# Patient Record
Sex: Female | Born: 1955 | Race: Black or African American | Hispanic: No | Smoking: Current every day smoker
Health system: Southern US, Community
[De-identification: ages and names within clinical notes are randomized; demographics above are authoritative.]

## PROBLEM LIST (undated history)

## (undated) DIAGNOSIS — M5440 Lumbago with sciatica, unspecified side: Secondary | ICD-10-CM

## (undated) DIAGNOSIS — E119 Type 2 diabetes mellitus without complications: Secondary | ICD-10-CM

## (undated) DIAGNOSIS — I10 Essential (primary) hypertension: Secondary | ICD-10-CM

## (undated) DIAGNOSIS — F1410 Cocaine abuse, uncomplicated: Secondary | ICD-10-CM

## (undated) DIAGNOSIS — Z91148 Patient's other noncompliance with medication regimen for other reason: Secondary | ICD-10-CM

## (undated) DIAGNOSIS — F319 Bipolar disorder, unspecified: Secondary | ICD-10-CM

## (undated) DIAGNOSIS — M5441 Lumbago with sciatica, right side: Secondary | ICD-10-CM

## (undated) DIAGNOSIS — Z9114 Patient's other noncompliance with medication regimen: Secondary | ICD-10-CM

## (undated) DIAGNOSIS — F209 Schizophrenia, unspecified: Secondary | ICD-10-CM

## (undated) DIAGNOSIS — G8929 Other chronic pain: Secondary | ICD-10-CM

## (undated) DIAGNOSIS — E785 Hyperlipidemia, unspecified: Secondary | ICD-10-CM

## (undated) DIAGNOSIS — J40 Bronchitis, not specified as acute or chronic: Secondary | ICD-10-CM

## (undated) HISTORY — PX: TUBAL LIGATION: SHX77

---

## 2013-03-06 ENCOUNTER — Emergency Department: Payer: Self-pay | Admitting: Emergency Medicine

## 2013-03-16 ENCOUNTER — Emergency Department: Payer: Self-pay | Admitting: Internal Medicine

## 2013-03-16 LAB — URINALYSIS, COMPLETE
Bacteria: NONE SEEN
Bilirubin,UR: NEGATIVE
Blood: NEGATIVE
Ketone: NEGATIVE
Leukocyte Esterase: NEGATIVE
Ph: 6 (ref 4.5–8.0)
RBC,UR: 1 /HPF (ref 0–5)
Specific Gravity: 1.015 (ref 1.003–1.030)
WBC UR: <1 /HPF (ref 0–5)

## 2013-03-16 LAB — BASIC METABOLIC PANEL
Anion Gap: 6 — ABNORMAL LOW (ref 7–16)
BUN: 9 mg/dL (ref 7–18)
Calcium, Total: 8.7 mg/dL (ref 8.5–10.1)
Co2: 26 mmol/L (ref 21–32)
Glucose: 125 mg/dL — ABNORMAL HIGH (ref 65–99)
Potassium: 4.3 mmol/L (ref 3.5–5.1)
Sodium: 136 mmol/L (ref 136–145)

## 2013-03-16 LAB — CBC
HCT: 33.1 % — ABNORMAL LOW (ref 35.0–47.0)
HGB: 10.9 g/dL — ABNORMAL LOW (ref 12.0–16.0)
MCH: 26.3 pg (ref 26.0–34.0)
MCV: 80 fL (ref 80–100)
Platelet: 259 10*3/uL (ref 150–440)
WBC: 7.2 10*3/uL (ref 3.6–11.0)

## 2013-03-16 LAB — TROPONIN I: Troponin-I: <0.02 ng/mL

## 2013-03-16 LAB — DRUG SCREEN, URINE
Barbiturates, Ur Screen: NEGATIVE (ref ?–200)
Benzodiazepine, Ur Scrn: NEGATIVE (ref ?–200)
Cannabinoid 50 Ng, Ur ~~LOC~~: NEGATIVE (ref ?–50)
MDMA (Ecstasy)Ur Screen: NEGATIVE (ref ?–500)
Methadone, Ur Screen: NEGATIVE (ref ?–300)
Phencyclidine (PCP) Ur S: NEGATIVE (ref ?–25)
Tricyclic, Ur Screen: NEGATIVE (ref ?–1000)

## 2013-03-16 LAB — CK TOTAL AND CKMB (NOT AT ARMC)
CK, Total: 311 U/L — ABNORMAL HIGH (ref 21–215)
CK-MB: 2.1 ng/mL (ref 0.5–3.6)

## 2014-03-13 IMAGING — CR DG CHEST 2V
1 series · 2 of 2 positions shown · non-contrast
Comparison: none

REASON FOR EXAM: SOB
COMMENTS:

PROCEDURE:     DXR - DXR CHEST PA (OR AP) AND LATERAL  - March 16, 2013  [DATE]
RESULT:     The lungs are clear. The heart and pulmonary vessels are normal.
The bony and mediastinal structures are unremarkable. There is no effusion.
There is no pneumothorax or evidence of congestive failure.

[Series 1: w chest pa · 0.14mm/px · 2 of 2 slices shown]
[im 1/2]
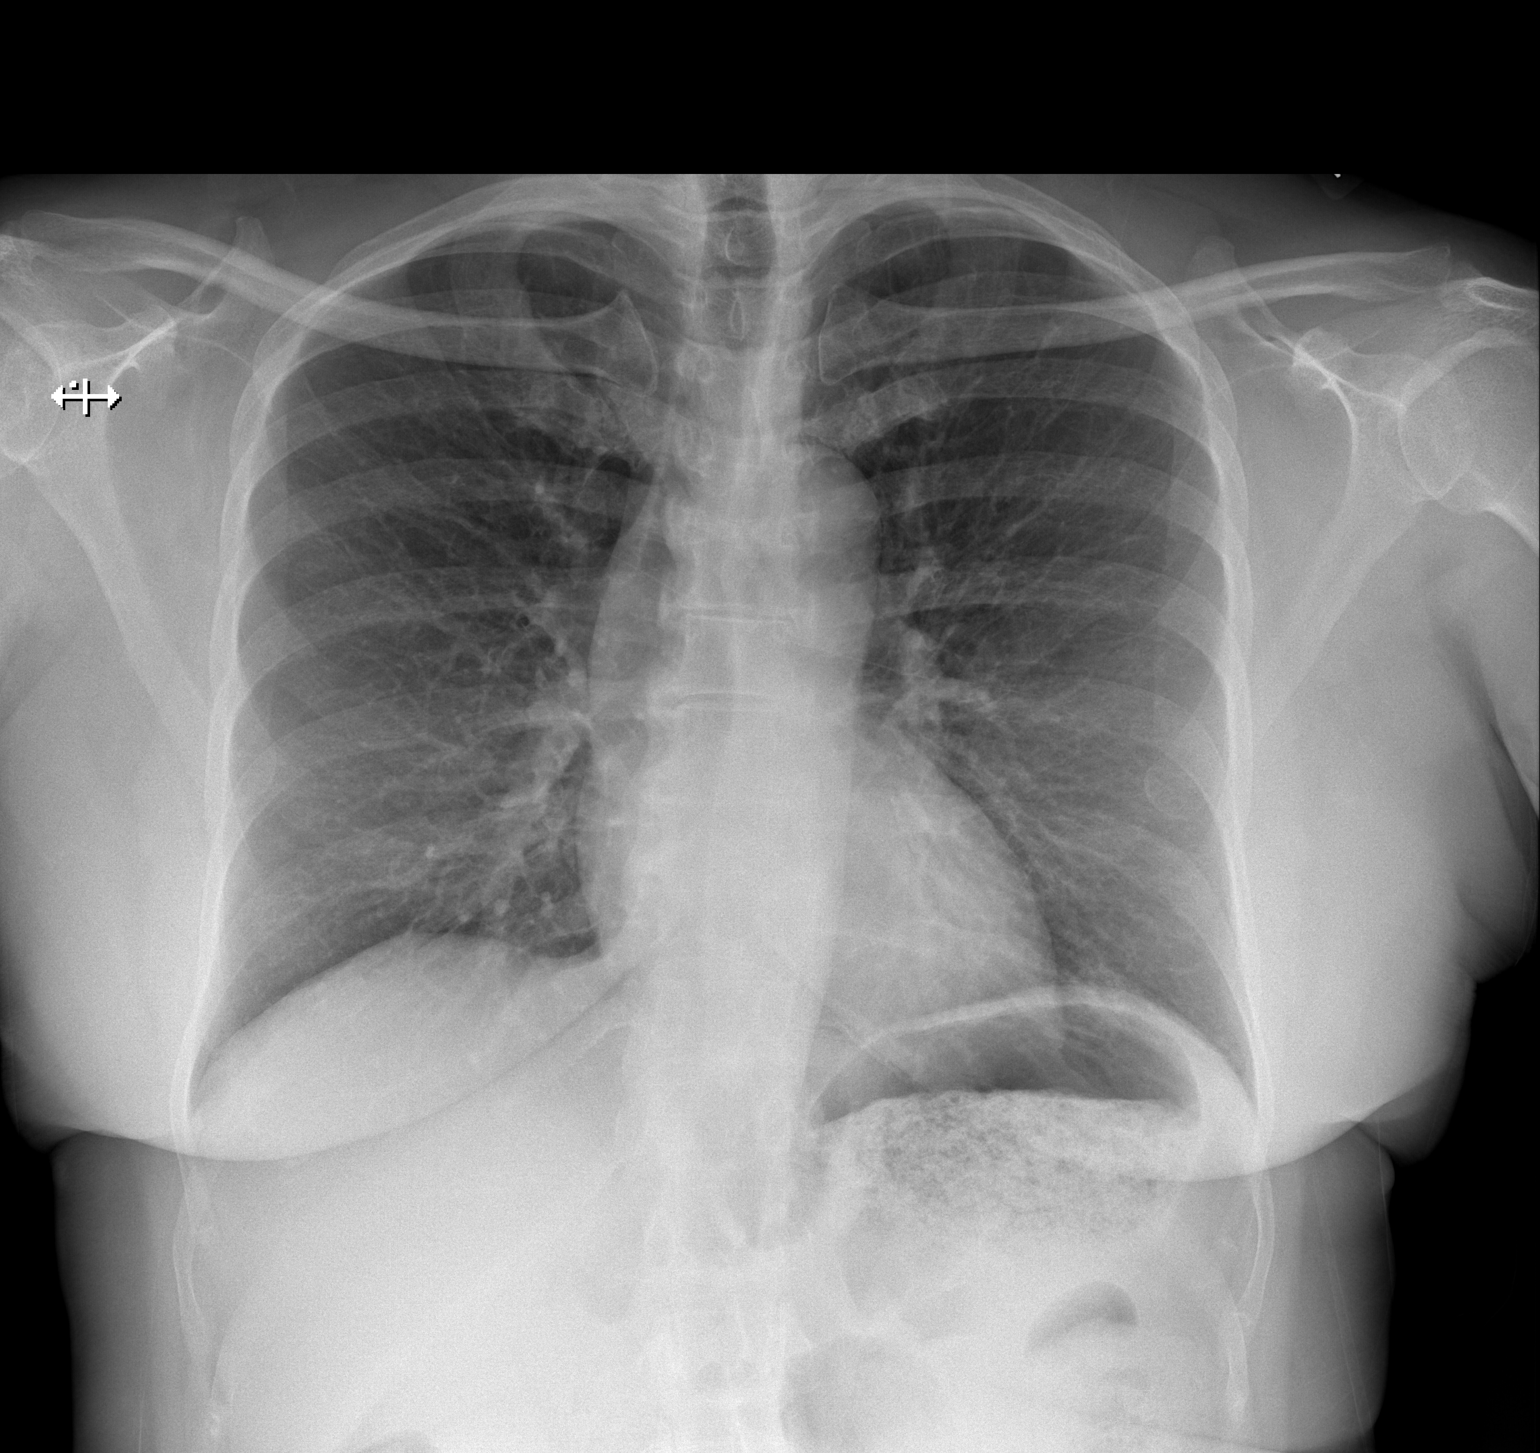
[im 2/2]
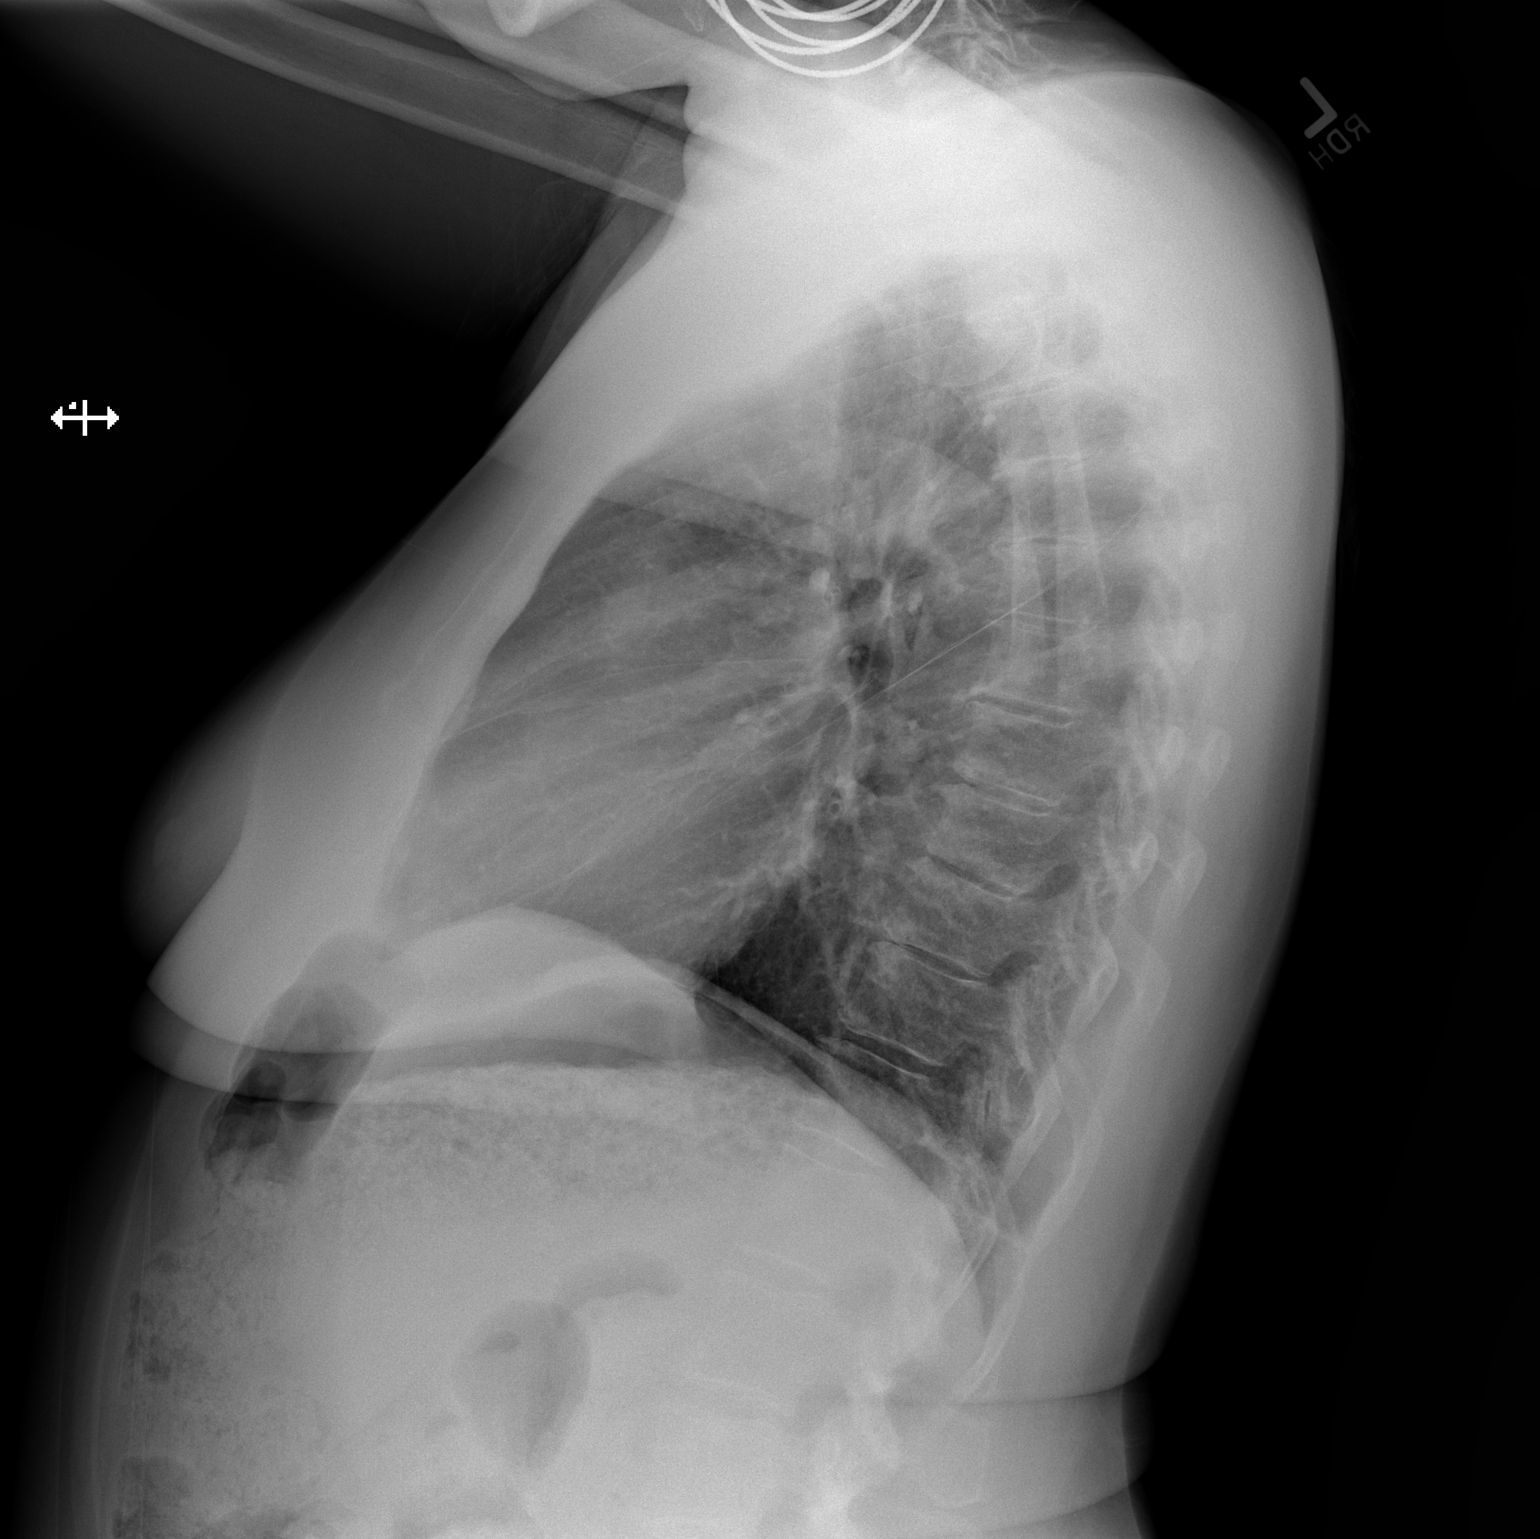

[2 of 2 positions shown; findings below may reference images not displayed]

IMPRESSION: No acute cardiopulmonary disease.

[REDACTED]

## 2016-06-02 ENCOUNTER — Encounter: Payer: Self-pay | Admitting: Emergency Medicine

## 2016-06-02 ENCOUNTER — Emergency Department
Admission: EM | Admit: 2016-06-02 | Discharge: 2016-06-05 | Disposition: A | Discharge status: Other Acute Inpatient Hospital | Payer: Medicare (Managed Care) | Attending: Emergency Medicine | Admitting: Emergency Medicine

## 2016-06-02 DIAGNOSIS — F17200 Nicotine dependence, unspecified, uncomplicated: Secondary | ICD-10-CM | POA: Diagnosis present

## 2016-06-02 DIAGNOSIS — F315 Bipolar disorder, current episode depressed, severe, with psychotic features: Secondary | ICD-10-CM | POA: Diagnosis present

## 2016-06-02 DIAGNOSIS — Z79899 Other long term (current) drug therapy: Secondary | ICD-10-CM | POA: Diagnosis not present

## 2016-06-02 DIAGNOSIS — E785 Hyperlipidemia, unspecified: Secondary | ICD-10-CM | POA: Diagnosis not present

## 2016-06-02 DIAGNOSIS — E119 Type 2 diabetes mellitus without complications: Secondary | ICD-10-CM | POA: Diagnosis not present

## 2016-06-02 DIAGNOSIS — F319 Bipolar disorder, unspecified: Secondary | ICD-10-CM | POA: Diagnosis not present

## 2016-06-02 DIAGNOSIS — R45851 Suicidal ideations: Secondary | ICD-10-CM | POA: Insufficient documentation

## 2016-06-02 DIAGNOSIS — F17210 Nicotine dependence, cigarettes, uncomplicated: Secondary | ICD-10-CM | POA: Diagnosis not present

## 2016-06-02 DIAGNOSIS — I10 Essential (primary) hypertension: Secondary | ICD-10-CM | POA: Diagnosis not present

## 2016-06-02 DIAGNOSIS — F209 Schizophrenia, unspecified: Secondary | ICD-10-CM | POA: Diagnosis not present

## 2016-06-02 DIAGNOSIS — K219 Gastro-esophageal reflux disease without esophagitis: Secondary | ICD-10-CM | POA: Diagnosis present

## 2016-06-02 DIAGNOSIS — F1320 Sedative, hypnotic or anxiolytic dependence, uncomplicated: Secondary | ICD-10-CM | POA: Diagnosis present

## 2016-06-02 HISTORY — DX: Type 2 diabetes mellitus without complications: E11.9

## 2016-06-02 HISTORY — DX: Schizophrenia, unspecified: F20.9

## 2016-06-02 HISTORY — DX: Bronchitis, not specified as acute or chronic: J40

## 2016-06-02 HISTORY — DX: Hyperlipidemia, unspecified: E78.5

## 2016-06-02 HISTORY — DX: Bipolar disorder, unspecified: F31.9

## 2016-06-02 HISTORY — DX: Essential (primary) hypertension: I10

## 2016-06-02 LAB — ETHANOL

## 2016-06-02 LAB — COMPREHENSIVE METABOLIC PANEL
ALK PHOS: 71 U/L (ref 38–126)
ALT: 15 U/L (ref 14–54)
AST: 19 U/L (ref 15–41)
Albumin: 4.2 g/dL (ref 3.5–5.0)
Anion gap: 8 (ref 5–15)
BILIRUBIN TOTAL: 0.6 mg/dL (ref 0.3–1.2)
BUN: 13 mg/dL (ref 6–20)
CALCIUM: 9.5 mg/dL (ref 8.9–10.3)
CO2: 25 mmol/L (ref 22–32)
CREATININE: 0.81 mg/dL (ref 0.44–1.00)
Chloride: 105 mmol/L (ref 101–111)
GFR calc Af Amer: >60 mL/min (ref >60)
Glucose, Bld: 120 mg/dL — ABNORMAL HIGH (ref 65–99)
Potassium: 3.9 mmol/L (ref 3.5–5.1)
Sodium: 138 mmol/L (ref 135–145)
TOTAL PROTEIN: 8.2 g/dL — AB (ref 6.5–8.1)

## 2016-06-02 LAB — URINE DRUG SCREEN, QUALITATIVE (ARMC ONLY)
Amphetamines, Ur Screen: NOT DETECTED
Barbiturates, Ur Screen: NOT DETECTED
Benzodiazepine, Ur Scrn: POSITIVE — AB
CANNABINOID 50 NG, UR ~~LOC~~: NOT DETECTED
Cocaine Metabolite,Ur ~~LOC~~: POSITIVE — AB
MDMA (ECSTASY) UR SCREEN: NOT DETECTED
Methadone Scn, Ur: NOT DETECTED
Opiate, Ur Screen: NOT DETECTED
PHENCYCLIDINE (PCP) UR S: NOT DETECTED
Tricyclic, Ur Screen: NOT DETECTED

## 2016-06-02 LAB — CBC
HCT: 36.2 % (ref 35.0–47.0)
Hemoglobin: 11.6 g/dL — ABNORMAL LOW (ref 12.0–16.0)
MCH: 26.4 pg (ref 26.0–34.0)
MCHC: 32.2 g/dL (ref 32.0–36.0)
MCV: 82.1 fL (ref 80.0–100.0)
PLATELETS: 258 10*3/uL (ref 150–440)
RBC: 4.41 MIL/uL (ref 3.80–5.20)
RDW: 17.3 % — AB (ref 11.5–14.5)
WBC: 6.5 10*3/uL (ref 3.6–11.0)

## 2016-06-02 LAB — SALICYLATE LEVEL: Salicylate Lvl: 4.4 mg/dL (ref 2.8–30.0)

## 2016-06-02 LAB — ACETAMINOPHEN LEVEL: Acetaminophen (Tylenol), Serum: <10 ug/mL — ABNORMAL LOW (ref 10–30)

## 2016-06-02 MED ORDER — LORAZEPAM 1 MG PO TABS
1.0000 mg | ORAL_TABLET | Freq: Once | ORAL | Status: AC
  Administered 2016-06-02: 1 mg via ORAL
  Filled 2016-06-02: qty 1
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
## 2016-06-02 NOTE — ED Notes (Signed)
meds updated per pt verbal - she [facility] med[facility]  [place] - pharm tech to call in the am to verify meds

## 2016-06-02 NOTE — ED Provider Notes (Signed)
Time Seen: Approximately 1740 I have reviewed the triage notes  Chief Complaint: Suicidal and Depression   History of Present Illness: Linda Banks is a 60 y.o. female who presents with some suicidal ideation over the past week. Patient has a history of bipolar disease and schizophrenia. Medical history includes diabetes and hypertension. Patient states that she's been out of her pills that she takes for depression and bipolar disease over the past week or more. She states the last time that she had any medication was at the end of last month. Patient denies any suicidal plan. She states she has been hearing voices and has some paranoid thoughts. She has had 2 previous episodes of suicide attempts remotely with attempting to cut her wrist tendon 12 years ago and also had a overdose of medication. Patient states that she's had a lot of stress in her family with recent death of her mom and then chaos of the home in [place] and she's been staying locally in [place] with a cousin  Past Medical History  Diagnosis Date  . Bipolar 1 disorder (HCC)   . Schizophrenia (HCC)   . Diabetes mellitus without complication (HCC)   . Hypertension   . Hyperlipidemia   . Bronchitis     There are no active problems to display for this patient.   Past Surgical History  Procedure Laterality Date  . Tubal ligation      Past Surgical History  Procedure Laterality Date  . Tubal ligation      No current outpatient prescriptions on file.  Allergies:  Review of patient's allergies indicates no known allergies.  Family History: History reviewed. No pertinent family history.  Social History: Social History  Substance Use Topics  . Smoking status: Current Every Day Smoker -- 1.00 packs/day    Types: Cigarettes  . Smokeless tobacco: Never Used  . Alcohol Use: No     Review of Systems:   10 point review of systems was performed and was otherwise negative:  Constitutional: No fever Eyes: No  visual disturbances ENT: No sore throat, ear pain Cardiac: No chest pain Respiratory: No shortness of breath, wheezing, or stridor Abdomen: No abdominal pain, no vomiting, No diarrhea Endocrine: No weight loss, No night sweats Extremities: No peripheral edema, cyanosis Skin: No rashes, easy bruising Neurologic: No focal weakness, trouble with speech or swollowing Urologic: No dysuria, Hematuria, or urinary frequency   Physical Exam:  ED Triage Vitals  Enc Vitals Group     BP 06/02/16 1702 150/94 mmHg     Pulse Rate 06/02/16 1702 98     Resp 06/02/16 1702 18     Temp 06/02/16 1705 98.4 F (36.9 C)     Temp Source 06/02/16 1705 Oral     SpO2 06/02/16 1702 100 %     Weight 06/02/16 1702 176 lb (79.833 kg)     Height 06/02/16 1702 5\' 4"  (1.626 m)     Head Cir --      Peak Flow --      Pain Score 06/02/16 1702 8     Pain Loc --      Pain Edu? --      Excl. in GC? --     General: Awake , Alert , and Oriented times 3; GCS 15 Head: Normal cephalic , atraumatic Eyes: Pupils equal , round, reactive to light Nose/Throat: No nasal drainage, patent upper airway without erythema or exudate.  Neck: Supple, Full range of motion, No anterior adenopathy or palpable  thyroid masses Lungs: Clear to ascultation without wheezes , rhonchi, or rales Heart: Regular rate, regular rhythm without murmurs , gallops , or rubs Abdomen: Soft, non tender without rebound, guarding , or rigidity; bowel sounds positive and symmetric in all 4 quadrants. No organomegaly .        Extremities: 2 plus symmetric pulses. No edema, clubbing or cyanosis Neurologic: normal ambulation, Motor symmetric without deficits, sensory intact Skin: warm, dry, no rashes   Labs:   All laboratory work was reviewed including any pertinent negatives or positives listed below:  Labs Reviewed  CBC - Abnormal; Notable for the following:    Hemoglobin 11.6 (*)    RDW 17.3 (*)    All other components within normal limits   COMPREHENSIVE METABOLIC PANEL  ETHANOL  SALICYLATE LEVEL  ACETAMINOPHEN LEVEL  URINE DRUG SCREEN, QUALITATIVE ([facility] ONLY)  Laboratory work was reviewed and showed no clinically significant abnormalities.    ED Course:  Patient's here voluntarily and I felt we did not need to fill out an involuntary commitment paperwork and I don't feel she is a significant flight risk. If she does have sudden intense to leave then I will fill out IVC paperwork. Patient describes suicidal thoughts over the past week. She voluntarily presented here and has no physical complaints. Patient is medically cleared for psychiatric evaluation. We will attempt to continue her on her normal medications    Assessment: * Suicidal ideation Paranoid delusions      Plan: * Psychiatric consultation We will follow psychiatric recommendations.           Jennye MoccasinBrian S Quigley, MD 06/02/16 [id number]

## 2016-06-02 NOTE — ED Notes (Signed)
TTS att

## 2016-06-02 NOTE — ED Notes (Signed)

## 2016-06-02 NOTE — ED Notes (Signed)
Patient given sprite and graham crackers upon request.
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
## 2016-06-03 ENCOUNTER — Encounter: Payer: Self-pay | Admitting: Psychiatry

## 2016-06-03 DIAGNOSIS — F315 Bipolar disorder, current episode depressed, severe, with psychotic features: Secondary | ICD-10-CM | POA: Diagnosis not present

## 2016-06-03 DIAGNOSIS — K219 Gastro-esophageal reflux disease without esophagitis: Secondary | ICD-10-CM | POA: Diagnosis present

## 2016-06-03 DIAGNOSIS — F17200 Nicotine dependence, unspecified, uncomplicated: Secondary | ICD-10-CM | POA: Diagnosis present

## 2016-06-03 DIAGNOSIS — R45851 Suicidal ideations: Secondary | ICD-10-CM

## 2016-06-03 DIAGNOSIS — F1320 Sedative, hypnotic or anxiolytic dependence, uncomplicated: Secondary | ICD-10-CM | POA: Diagnosis present

## 2016-06-03 MED ORDER — NICOTINE 21 MG/24HR TD PT24
21.0000 mg | MEDICATED_PATCH | Freq: Every day | TRANSDERMAL | Status: DC
  Filled 2016-06-03 (×2): qty 1

## 2016-06-03 MED ORDER — ESCITALOPRAM OXALATE 10 MG PO TABS
20.0000 mg | ORAL_TABLET | Freq: Every day | ORAL | Status: DC
  Administered 2016-06-03 – 2016-06-05 (×3): 20 mg via ORAL
  Filled 2016-06-03 (×3): qty 2

## 2016-06-03 MED ORDER — IBUPROFEN 800 MG PO TABS
ORAL_TABLET | ORAL | Status: AC
  Administered 2016-06-03: 800 mg via ORAL
  Filled 2016-06-03: qty 1

## 2016-06-03 MED ORDER — LORAZEPAM 2 MG PO TABS
2.0000 mg | ORAL_TABLET | Freq: Four times a day (QID) | ORAL | Status: DC | PRN
  Administered 2016-06-03: 2 mg via ORAL

## 2016-06-03 MED ORDER — QUETIAPINE FUMARATE 200 MG PO TABS
200.0000 mg | ORAL_TABLET | Freq: Every day | ORAL | Status: DC
  Administered 2016-06-03 – 2016-06-04 (×2): 200 mg via ORAL
  Filled 2016-06-03 (×2): qty 1

## 2016-06-03 MED ORDER — HALOPERIDOL 5 MG PO TABS
ORAL_TABLET | ORAL | Status: AC
  Administered 2016-06-03: 5 mg via ORAL
  Filled 2016-06-03: qty 1

## 2016-06-03 MED ORDER — LORAZEPAM 2 MG PO TABS
ORAL_TABLET | ORAL | Status: AC
  Filled 2016-06-03: qty 1

## 2016-06-03 MED ORDER — CHLORDIAZEPOXIDE HCL 25 MG PO CAPS
25.0000 mg | ORAL_CAPSULE | Freq: Four times a day (QID) | ORAL | Status: DC
  Administered 2016-06-03 – 2016-06-05 (×8): 25 mg via ORAL
  Filled 2016-06-03 (×8): qty 1

## 2016-06-03 MED ORDER — TRAZODONE HCL 100 MG PO TABS
100.0000 mg | ORAL_TABLET | Freq: Every day | ORAL | Status: DC
  Administered 2016-06-03 – 2016-06-04 (×2): 100 mg via ORAL
  Filled 2016-06-03 (×2): qty 1

## 2016-06-03 MED ORDER — PANTOPRAZOLE SODIUM 40 MG PO TBEC
40.0000 mg | DELAYED_RELEASE_TABLET | Freq: Every day | ORAL | Status: DC
  Administered 2016-06-03 – 2016-06-05 (×3): 40 mg via ORAL
  Filled 2016-06-03 (×3): qty 1

## 2016-06-03 MED ORDER — DIPHENHYDRAMINE HCL 25 MG PO CAPS
50.0000 mg | ORAL_CAPSULE | Freq: Four times a day (QID) | ORAL | Status: DC | PRN
Start: 2016-06-03 — End: 2016-06-05
  Administered 2016-06-03 (×2): 50 mg via ORAL
  Filled 2016-06-03 (×2): qty 2

## 2016-06-03 MED ORDER — IBUPROFEN 800 MG PO TABS
800.0000 mg | ORAL_TABLET | Freq: Once | ORAL | Status: AC
  Administered 2016-06-03: 800 mg via ORAL

## 2016-06-03 MED ORDER — HALOPERIDOL 5 MG PO TABS
5.0000 mg | ORAL_TABLET | Freq: Four times a day (QID) | ORAL | Status: DC | PRN
  Administered 2016-06-03 (×2): 5 mg via ORAL
  Filled 2016-06-03: qty 1

## 2016-06-03 NOTE — ED Notes (Signed)
Patient resting quietly in room. No noted distress or abnormal behaviors noted. Will continue 15 minute checks and observation by security camera for safety. 

## 2016-06-03 NOTE — ED Notes (Signed)
ED BHU PLACEMENT JUSTIFICATION Is the patient under IVC or is there intent for IVC: No Is the patient medically cleared: Yes.   Is there vacancy in the ED BHU: Yes.   Is the population mix appropriate for patient: Yes.   Is the patient awaiting placement in inpatient or outpatient setting: Yes.   Has the patient had a psychiatric consult: Yes.   Survey of unit performed for contraband, proper placement and condition of furniture, tampering with fixtures in bathroom, shower, and each patient room: Yes.   APPEARANCE/BEHAVIOR calm, cooperative and adequate rapport can be established NEURO ASSESSMENT Orientation: time, place and person Hallucinations: Yes.  None noted (Hallucinations) Speech: Normal Gait: normal RESPIRATORY ASSESSMENT Normal expansion.  Clear to auscultation.  No rales, rhonchi, or wheezing. CARDIOVASCULAR ASSESSMENT regular rate and rhythm, S1, S2 normal, no murmur, click, rub or gallop GASTROINTESTINAL ASSESSMENT soft, nontender, BS WNL, no r/g EXTREMITIES normal strength, tone, and muscle mass PLAN OF CARE Provide calm/safe environment. Vital signs assessed twice daily. ED BHU Assessment once each 12-hour shift. Collaborate with intake RN daily or as condition indicates. Assure the ED provider has rounded once each shift. Provide and encourage hygiene. Provide redirection as needed. Assess for escalating behavior; address immediately and inform ED provider.  Assess family dynamic and appropriateness for visitation as needed: Yes.   Educate the patient/family about BHU procedures/visitation: Yes.

## 2016-06-03 NOTE — ED Notes (Signed)
voluntary 

## 2016-06-03 NOTE — ED Notes (Signed)
Pt calm and resting in bed. Compliant with medications. No concerns/ needs voiced at this time. No distress noted. Maintained on 15 minute checks and observation by security camera for safety.

## 2016-06-03 NOTE — Progress Notes (Signed)
Attempted to secure placement with the following facilities:  [facility]:  [facility]  Maryelizabeth Rowanressa Jasiyah Poland, MSW, Clare CharonLCSW, LCAS [facility] Triage Specialist [phone]

## 2016-06-03 NOTE — ED Notes (Signed)
Pt laying down in room. Pt denied any further needs at this time. Maintained on 15 minute checks and observation by security camera for safety.

## 2016-06-03 NOTE — ED Notes (Signed)
Report was received from Verdis Fredericksonuth B., RN; Pt. Verbalizes  complaints of being anxious; had a panic attack; with being tearfull; yelling loudly; orders were received for PRN medications; distress; verbalizes having S.I.; denies having Hi. Continue to monitor with 15 min. Monitoring.

## 2016-06-03 NOTE — ED Provider Notes (Signed)
-----------------------------------------   6:46 AM on 06/03/2016 -----------------------------------------   Blood pressure 111/78, pulse 91, temperature 98.1 F (36.7 C), temperature source Oral, resp. rate 18, height 5\' 4"  (1.626 m), weight 176 lb (79.833 kg), SpO2 98 %.  The patient had no acute events since last update.  Calm and cooperative at this time.  Disposition is pending per Psychiatry/Behavioral Medicine team recommendations.     Irean HongJade J Jamiracle Avants, MD 06/03/16 [phone]

## 2016-06-03 NOTE — ED Notes (Signed)
Patient c/o being nervous and anxious and stating, "can I get anything now."

## 2016-06-03 NOTE — ED Notes (Signed)
Pt crying hysterically, yelling in room. Also c/o hearing voices. Psychiatrist notified and will place orders.

## 2016-06-03 NOTE — ED Notes (Signed)
Psychiatrist placed orders. Medication administered as ordered.

## 2016-06-03 NOTE — ED Notes (Signed)
Psychiatrist at bedside. Pt has continued to rest comfortably in room. No concerns voiced. No distress noted. Maintained on 15 minute checks and observation by security camera for safety.

## 2016-06-03 NOTE — ED Notes (Signed)
Pt lying in bed tearful. Pt was soft spoken and did not offer much information to RN. Pt denies SI/ HI and pain.  Pt had depressed affect. Barely ate any breakfast.

## 2016-06-03 NOTE — ED Notes (Signed)
Report was received from Samella ParrNoel R., RN; Pt. Verbalizes no complaints or distress; verbalizes having S.I.; denies having Hi. Continue to monitor with 15 min. Monitoring.

## 2016-06-03 NOTE — ED Notes (Signed)
Pt will be admitted to BMU when bed is available. Pt accepting. Pt asking for mediation for anxiety, pacing in day room.  MD will be notified. Maintained on 15 minute checks and observation by security camera for safety.

## 2016-06-03 NOTE — Consult Note (Addendum)
[facility] Psychiatry Consult   Reason for Consult:  Suicidal ideation. Referring Physician:  Dr. Cinda Quest Patient Identification: Linda Banks MRN:  [id number] Principal Diagnosis: Bipolar I disorder, most recent episode (or current) depressed, severe, specified as with psychotic behavior ([place]) Diagnosis:   Patient Active Problem List   Diagnosis Date Noted  . Bipolar I disorder, most recent episode (or current) depressed, severe, specified as with psychotic behavior ([place]) [F31.5] 06/03/2016  . Suicidal ideation [R45.851] 06/03/2016  . GERD (gastroesophageal reflux disease) [K21.9] 06/03/2016  . Tobacco use disorder [F17.200] 06/03/2016  . Sedative, hypnotic or anxiolytic use disorder, severe, dependence ([place]) [F13.20] 06/03/2016    Total Time spent with patient: 1 hour  Subjective:    Identifying data. Linda Banks is a 60 y.o. female with a history of bipolar depression.  Chief complaint. "I am very sad."  History of present illness. Information was obtained from the patient and the chart. The patient has a long history of bipolar illness with multiple psychiatric hospitalizations. She was doing well on a combination of Lexapro and Seroquel prescribed by her providers in [place]. In addition she was prescribed Xanax 2 mg 3 times a day and narcotic painkillers. In May 16, 2023 her mother passed away. The patient moved in with her mother to take care of her but following her and another family member clearing the house and the patient was forced to come to [place] to stay with a relative. She came here 3 days ago and has been off medications as she does not establish care in [place] as of yet. She became increasingly depressed with poor sleep, decreased appetite, anhedonia, feeling of guilt and hopelessness worthlessness, poor energy and concentration, social isolation, crying spells, and suicidal ideation. The patient denies psychotic symptoms on initial evaluation by today in the  emergency room she became very agitated loud and hallucinating. The patient complains of heightened anxiety possibly due to Xanax withdrawal. She denies alcohol or illicit substance use.  Past psychiatric history. Reports 4 or 5 hospitalizations mostly in [place]. She has been tried on multiple medications but like Seroquel the most. He attempted suicide by cutting 10 years ago.  Family psychiatric history. According to the patient everybody has mental illness.  Social history. She plans to stay in [place].  Risk to Self: Is patient at risk for suicide?: Yes Risk to Others:   Prior Inpatient Therapy:   Prior Outpatient Therapy:    Past Medical History:  Past Medical History  Diagnosis Date  . Bipolar 1 disorder ([place])   . Schizophrenia ([place])   . Diabetes mellitus without complication ([place])   . Hypertension   . Hyperlipidemia   . Bronchitis     Past Surgical History  Procedure Laterality Date  . Tubal ligation     Family History: History reviewed. No pertinent family history.  Social History:  History  Alcohol Use No     History  Drug Use No    Social History   Social History  . Marital Status: Divorced    Spouse Name: N/A  . Number of Children: N/A  . Years of Education: N/A   Social History Main Topics  . Smoking status: Current Every Day Smoker -- 1.00 packs/day    Types: Cigarettes  . Smokeless tobacco: Never Used  . Alcohol Use: No  . Drug Use: No  . Sexual Activity: Not Asked   Other Topics Concern  . None   Social History Narrative   Additional Social History:  Allergies:  No Known Allergies  Labs:  Results for orders placed or performed during the hospital encounter of 06/02/16 (from the past 48 hour(s))  Comprehensive metabolic panel     Status: Abnormal   Collection Time: 06/02/16  5:02 PM  Result Value Ref Range   Sodium 138 135 - 145 mmol/L   Potassium 3.9 3.5 - 5.1 mmol/L   Chloride 105 101 - 111 mmol/L   CO2 25 22 - 32 mmol/L    Glucose, Bld 120 (H) 65 - 99 mg/dL   BUN 13 6 - 20 mg/dL   Creatinine, Ser 0.81 0.44 - 1.00 mg/dL   Calcium 9.5 8.9 - 10.3 mg/dL   Total Protein 8.2 (H) 6.5 - 8.1 g/dL   Albumin 4.2 3.5 - 5.0 g/dL   AST 19 15 - 41 U/L   ALT 15 14 - 54 U/L   Alkaline Phosphatase 71 38 - 126 U/L   Total Bilirubin 0.6 0.3 - 1.2 mg/dL   GFR calc non Af Amer >60 >60 mL/min   GFR calc Af Amer >60 >60 mL/min    Comment: (NOTE) The eGFR has been calculated using the CKD EPI equation. This calculation has not been validated in all clinical situations. eGFR's persistently <60 mL/min signify possible Chronic Kidney Disease.    Anion gap 8 5 - 15  Ethanol     Status: None   Collection Time: 06/02/16  5:02 PM  Result Value Ref Range   Alcohol, Ethyl (B) <5 <5 mg/dL    Comment:        LOWEST DETECTABLE LIMIT FOR SERUM ALCOHOL IS 5 mg/dL FOR MEDICAL PURPOSES ONLY   Salicylate level     Status: None   Collection Time: 06/02/16  5:02 PM  Result Value Ref Range   Salicylate Lvl 4.4 2.8 - 30.0 mg/dL  Acetaminophen level     Status: Abnormal   Collection Time: 06/02/16  5:02 PM  Result Value Ref Range   Acetaminophen (Tylenol), Serum <10 (L) 10 - 30 ug/mL    Comment:        THERAPEUTIC CONCENTRATIONS VARY SIGNIFICANTLY. A RANGE OF 10-30 ug/mL MAY BE AN EFFECTIVE CONCENTRATION FOR MANY PATIENTS. HOWEVER, SOME ARE BEST TREATED AT CONCENTRATIONS OUTSIDE THIS RANGE. ACETAMINOPHEN CONCENTRATIONS >150 ug/mL AT 4 HOURS AFTER INGESTION AND >50 ug/mL AT 12 HOURS AFTER INGESTION ARE OFTEN ASSOCIATED WITH TOXIC REACTIONS.   cbc     Status: Abnormal   Collection Time: 06/02/16  5:02 PM  Result Value Ref Range   WBC 6.5 3.6 - 11.0 K/uL   RBC 4.41 3.80 - 5.20 MIL/uL   Hemoglobin 11.6 (L) 12.0 - 16.0 g/dL   HCT 36.2 35.0 - 47.0 %   MCV 82.1 80.0 - 100.0 fL   MCH 26.4 26.0 - 34.0 pg   MCHC 32.2 32.0 - 36.0 g/dL   RDW 17.3 (H) 11.5 - 14.5 %   Platelets 258 150 - 440 K/uL  Urine Drug Screen, Qualitative      Status: Abnormal   Collection Time: 06/02/16 11:34 PM  Result Value Ref Range   Tricyclic, Ur Screen NONE DETECTED NONE DETECTED   Amphetamines, Ur Screen NONE DETECTED NONE DETECTED   MDMA (Ecstasy)Ur Screen NONE DETECTED NONE DETECTED   Cocaine Metabolite,Ur [place] POSITIVE (A) NONE DETECTED   Opiate, Ur Screen NONE DETECTED NONE DETECTED   Phencyclidine (PCP) Ur S NONE DETECTED NONE DETECTED   Cannabinoid 50 Ng, Ur [place] NONE DETECTED NONE DETECTED   Barbiturates, Ur Screen  NONE DETECTED NONE DETECTED   Benzodiazepine, Ur Scrn POSITIVE (A) NONE DETECTED   Methadone Scn, Ur NONE DETECTED NONE DETECTED    Comment: (NOTE) 846  Tricyclics, urine               Cutoff 1000 ng/mL 200  Amphetamines, urine             Cutoff 1000 ng/mL 300  MDMA (Ecstasy), urine           Cutoff 500 ng/mL 400  Cocaine Metabolite, urine       Cutoff 300 ng/mL 500  Opiate, urine                   Cutoff 300 ng/mL 600  Phencyclidine (PCP), urine      Cutoff 25 ng/mL 700  Cannabinoid, urine              Cutoff 50 ng/mL 800  Barbiturates, urine             Cutoff 200 ng/mL 900  Benzodiazepine, urine           Cutoff 200 ng/mL 1000 Methadone, urine                Cutoff 300 ng/mL 1100 1200 The urine drug screen provides only a preliminary, unconfirmed 1300 analytical test result and should not be used for non-medical 1400 purposes. Clinical consideration and professional judgment should 1500 be applied to any positive drug screen result due to possible 1600 interfering substances. A more specific alternate chemical method 1700 must be used in order to obtain a confirmed analytical result.  1800 Gas chromato graphy / mass spectrometry (GC/MS) is the preferred 1900 confirmatory method.     Current Facility-Administered Medications  Medication Dose Route Frequency Provider Last Rate Last Dose  . chlordiazePOXIDE (LIBRIUM) capsule 25 mg  25 mg Oral QID Ruhi Kopke B Elianah Karis, MD      . diphenhydrAMINE (BENADRYL)  capsule 50 mg  50 mg Oral Q6H PRN Robertha Staples B Suede Greenawalt, MD      . escitalopram (LEXAPRO) tablet 20 mg  20 mg Oral Daily Takeru Bose B Cherelle Midkiff, MD      . haloperidol (HALDOL) 5 MG tablet           . haloperidol (HALDOL) tablet 5 mg  5 mg Oral Q6H PRN Amadou Katzenstein B Krissy Orebaugh, MD   5 mg at 06/03/16 1356  . LORazepam (ATIVAN) 2 MG tablet           . LORazepam (ATIVAN) tablet 2 mg  2 mg Oral Q6H PRN Clovis Fredrickson, MD   2 mg at 06/03/16 1356  . pantoprazole (PROTONIX) EC tablet 40 mg  40 mg Oral Daily Aven Cegielski B Pamalee Marcoe, MD      . QUEtiapine (SEROQUEL) tablet 200 mg  200 mg Oral QHS Mikenzie Mccannon B Maeleigh Buschman, MD      . traZODone (DESYREL) tablet 100 mg  100 mg Oral QHS Clovis Fredrickson, MD       Current Outpatient Prescriptions  Medication Sig Dispense Refill  . alprazolam (XANAX) 2 MG tablet Take 2 mg by mouth 3 (three) times daily as needed for sleep or anxiety.    Marland Kitchen escitalopram (LEXAPRO) 20 MG tablet Take 20 mg by mouth 2 (two) times daily.    Marland Kitchen esomeprazole (NEXIUM) 40 MG capsule Take 40 mg by mouth daily at 12 noon.    Marland Kitchen HYDROcodone-acetaminophen (NORCO) 10-325 MG tablet Take 1 tablet by mouth every 6 (six) hours as needed.     Marland Kitchen  hydrOXYzine (VISTARIL) 25 MG capsule Take 25 mg by mouth 3 (three) times daily.    . QUEtiapine (SEROQUEL XR) 300 MG 24 hr tablet Take 300 mg by mouth at bedtime.    Marland Kitchen zolpidem (AMBIEN) 10 MG tablet Take 10 mg by mouth at bedtime as needed for sleep.       Musculoskeletal: Strength & Muscle Tone: within normal limits Gait & Station: normal Patient leans: N/A  Psychiatric Specialty Exam: Physical Exam  Nursing note and vitals reviewed.   Review of Systems  Psychiatric/Behavioral: Positive for depression, suicidal ideas and hallucinations.  All other systems reviewed and are negative.   Blood pressure 111/78, pulse 91, temperature 98.1 F (36.7 C), temperature source Oral, resp. rate 18, height 5' 4"  (1.626 m), weight 79.833 kg (176 lb), SpO2 98 %.Body  mass index is 30.2 kg/(m^2).  General Appearance: Fairly Groomed  Eye Contact:  Fair  Speech:  Clear and Coherent  Volume:  Decreased  Mood:  Dysphoric  Affect:  Labile  Thought Process:  Disorganized  Orientation:  Full (Time, Place, and Person)  Thought Content:  Hallucinations: Auditory  Suicidal Thoughts:  Yes.  with intent/plan  Homicidal Thoughts:  No  Memory:  Immediate;   Fair Recent;   Fair Remote;   Fair  Judgement:  Impaired  Insight:  Lacking  Psychomotor Activity:  Increased  Concentration:  Concentration: Fair and Attention Span: Fair  Recall:  [employer] of Knowledge:  Fair  Language:  Fair  Akathisia:  No  Handed:  Right  AIMS (if indicated):     Assets:  Communication Skills Desire for Improvement Housing Physical Health Resilience Social Support  ADL's:  Intact  Cognition:  WNL  Sleep:        Treatment Plan Summary: Daily contact with patient to assess and evaluate symptoms and progress in treatment and Medication management  Disposition: Recommend psychiatric Inpatient admission when medically cleared. Supportive therapy provided about ongoing stressors.   Ms. Savard is a 60 year old female with history of bipolar illness admitted for suicidal ideation and psychotic behavior in the context of major loss and treatment noncompliance.  1. Suicidal ideation. The patient is able to contract for safety in the hospital.  2. Mood and psychosis. We will restart Seroquel for psychosis and mood stabilization and Lexapro for depression.  3. Agitation. A combination of Haldol and Ativan and Benadryl is available.  4. GERD. She is on Protonix.  5. Insomnia. Trazodone is available.  6. Metabolic syndrome screening. We will order a lipid panel, TSH, hemoglobin A1c, and prolactin.  7. Benzodiazepine dependence. We'll offer brief Librium taper.  8. Chronic pain of unknown origin. Will not prescribe narcotic painkillers.  43. Ms. Clites will be transferred to  psychiatry as soon as bed is available.  Orson Slick, MD 06/03/2016 2:09 PM

## 2016-06-03 NOTE — Progress Notes (Signed)
This Clinical research associatewriter consulted with Dr. Jennet MaduroPucilowska it was recommended to refer for inpatient hospitalization.      Maryelizabeth Rowanressa Damichael Hofman, MSW, Clare CharonLCSW, LCAS [facility] Triage Specialist [phone]

## 2016-06-04 ENCOUNTER — Emergency Department: Payer: Medicare (Managed Care)

## 2016-06-04 MED ORDER — IBUPROFEN 600 MG PO TABS
600.0000 mg | ORAL_TABLET | Freq: Once | ORAL | Status: AC
  Administered 2016-06-04: 600 mg via ORAL

## 2016-06-04 MED ORDER — IBUPROFEN 600 MG PO TABS
ORAL_TABLET | ORAL | Status: AC
  Administered 2016-06-04: 600 mg via ORAL
  Filled 2016-06-04: qty 1

## 2016-06-04 NOTE — ED Notes (Signed)

## 2016-06-04 NOTE — ED Notes (Signed)
Patient resting quietly in room. No noted distress or abnormal behaviors noted. Will continue 15 minute checks and observation by security camera for safety. 

## 2016-06-04 NOTE — Consult Note (Signed)
  There are no new complaints. Tolerates medications well. We will admit to psychiatry tonight.

## 2016-06-04 NOTE — ED Notes (Signed)
Patient has just returned from having a CXR done.

## 2016-06-04 NOTE — ED Notes (Signed)

## 2016-06-04 NOTE — ED Notes (Signed)
Patient awake, alert, and oriented. Reports continued depressed mood with SI, no specific plan at present.  Patient cooperative with all nursing interventions.  Maintained on 15 minute checks and observation by security camera for safety.

## 2016-06-04 NOTE — ED Notes (Signed)
ED BHU PLACEMENT JUSTIFICATION Is the patient under IVC or is there intent for IVC: No. Is the patient medically cleared: Yes.   Is there vacancy in the ED BHU: Yes.   Is the population mix appropriate for patient: Yes.   Is the patient awaiting placement in inpatient or outpatient setting: Yes.   Has the patient had a psychiatric consult: Yes.   Survey of unit performed for contraband, proper placement and condition of furniture, tampering with fixtures in bathroom, shower, and each patient room: Yes.  ; Findings: NA APPEARANCE/BEHAVIOR calm, cooperative and adequate rapport can be established NEURO ASSESSMENT Orientation: time, place and person Hallucinations: No.None noted (Hallucinations) Speech: Normal Gait: normal RESPIRATORY ASSESSMENT Normal expansion.  Clear to auscultation.  No rales, rhonchi, or wheezing. CARDIOVASCULAR ASSESSMENT regular rate and rhythm, S1, S2 normal, no murmur, click, rub or gallop GASTROINTESTINAL ASSESSMENT soft, nontender, BS WNL, no r/g EXTREMITIES normal strength, tone, and muscle mass PLAN OF CARE Provide calm/safe environment. Vital signs assessed twice daily. ED BHU Assessment once each 12-hour shift. Collaborate with intake RN daily or as condition indicates. Assure the ED provider has rounded once each shift. Provide and encourage hygiene. Provide redirection as needed. Assess for escalating behavior; address immediately and inform ED provider.  Assess family dynamic and appropriateness for visitation as needed: Yes.  ; If necessary, describe findings: NA Educate the patient/family about BHU procedures/visitation: Yes.  ; If necessary, describe findings: NA  

## 2016-06-04 NOTE — ED Notes (Signed)

## 2016-06-04 NOTE — ED Notes (Signed)
Patient out in the dayroom area briefly to watch television. Maintained on 15 minute checks and observation by security camera for safety.

## 2016-06-04 NOTE — ED Notes (Signed)
Patient asleep in room. No noted distress or abnormal behavior. Will continue 15 minute checks and observation by security cameras for safety.  Meal tray left at bedside. 

## 2016-06-04 NOTE — ED Notes (Signed)
Patient asleep in room. No noted distress or abnormal behavior. Will continue 15 minute checks and observation by security cameras for safety. 

## 2016-06-04 NOTE — Progress Notes (Signed)
TTS has spoken with [facility] in regards to possibly [facility] pt. Pt not acceptable at this time due to acuity.   06/04/2016 Cheryl FlashNicole Manuel Dall, MS, NCC, LPCA Therapeutic Triage Specialist

## 2016-06-04 NOTE — ED Provider Notes (Signed)
-----------------------------------------   7:24 AM on 06/04/2016 -----------------------------------------   Blood pressure 128/72, pulse 85, temperature 98.1 F (36.7 C), temperature source Oral, resp. rate 18, height 5\' 4"  (1.626 m), weight 176 lb (79.833 kg), SpO2 99 %.  The patient had no acute events since last update.  Calm and cooperative at this time.   We are attempting to have the patient placed in an inpatient facility. I was called and the recommendation was that the patient received an EKG as well as a chest x-ray for clearance before she could go to a facility.  The patient received some ibuprofen for back pain      Rebecka ApleyAllison P Webster, MD 06/04/16 [phone]

## 2016-06-04 NOTE — BH Assessment (Addendum)
Referral information for Geriatric Placement have been faxed to;    Linda Banks (Linda Banks), Pending Review    [facility]inda [facility] [phone] or [phone]),    Linda Banks [phone]),    Linda [phone])   [facility] (Linda Banks), Pending Review   [place] (Linda Banks), Declined, she lack acuity.    Linda GroveBrynn Marr [phone]),    [name] [phone]).

## 2016-06-04 NOTE — ED Notes (Signed)
Pt. Alert and oriented, warm and dry, in no distress. Pt. Denies SI, HI, and AVH. Pt. Encouraged to let nursing staff know of any concerns or needs. 

## 2016-06-04 NOTE — BH Assessment (Signed)
Received [facility] from [facility] ([phone]), requesting insurance information, EKG and Chest X-Ray. She's been considered for inpatient treatment with their facility. Writer informed ER MD (Dr. Zenda AlpersWebster) about the request and it's been ordered.  Writer spoke with Patient Access Christiane Ha(Jonathan) and he verified the patient do NOT have any insurance. Writer informed Therapist, [place] about insurance.

## 2016-06-05 ENCOUNTER — Inpatient Hospital Stay
Admission: RE | Admit: 2016-06-05 | Discharge: 2016-06-09 | DRG: 885 | Disposition: A | Discharge status: Home / Self Care | Payer: Medicare (Managed Care) | Source: Intra-hospital | Attending: Psychiatry | Admitting: Psychiatry

## 2016-06-05 DIAGNOSIS — G4700 Insomnia, unspecified: Secondary | ICD-10-CM | POA: Diagnosis present

## 2016-06-05 DIAGNOSIS — I10 Essential (primary) hypertension: Secondary | ICD-10-CM | POA: Diagnosis present

## 2016-06-05 DIAGNOSIS — F1320 Sedative, hypnotic or anxiolytic dependence, uncomplicated: Secondary | ICD-10-CM | POA: Diagnosis present

## 2016-06-05 DIAGNOSIS — Z9851 Tubal ligation status: Secondary | ICD-10-CM

## 2016-06-05 DIAGNOSIS — F17200 Nicotine dependence, unspecified, uncomplicated: Secondary | ICD-10-CM | POA: Diagnosis present

## 2016-06-05 DIAGNOSIS — Z79899 Other long term (current) drug therapy: Secondary | ICD-10-CM | POA: Diagnosis not present

## 2016-06-05 DIAGNOSIS — F315 Bipolar disorder, current episode depressed, severe, with psychotic features: Secondary | ICD-10-CM | POA: Diagnosis present

## 2016-06-05 DIAGNOSIS — K219 Gastro-esophageal reflux disease without esophagitis: Secondary | ICD-10-CM | POA: Diagnosis present

## 2016-06-05 DIAGNOSIS — E119 Type 2 diabetes mellitus without complications: Secondary | ICD-10-CM | POA: Diagnosis present

## 2016-06-05 DIAGNOSIS — F17210 Nicotine dependence, cigarettes, uncomplicated: Secondary | ICD-10-CM | POA: Diagnosis present

## 2016-06-05 DIAGNOSIS — F1420 Cocaine dependence, uncomplicated: Secondary | ICD-10-CM | POA: Diagnosis present

## 2016-06-05 DIAGNOSIS — Z915 Personal history of self-harm: Secondary | ICD-10-CM

## 2016-06-05 DIAGNOSIS — F419 Anxiety disorder, unspecified: Secondary | ICD-10-CM | POA: Diagnosis present

## 2016-06-05 DIAGNOSIS — R45851 Suicidal ideations: Secondary | ICD-10-CM | POA: Diagnosis present

## 2016-06-05 MED ORDER — MAGNESIUM HYDROXIDE 400 MG/5ML PO SUSP: 30.0000 mL | Freq: Every day | ORAL | Status: DC | PRN

## 2016-06-05 MED ORDER — TRAZODONE HCL 100 MG PO TABS
100.0000 mg | ORAL_TABLET | Freq: Every day | ORAL | Status: DC
  Filled 2016-06-05: qty 1

## 2016-06-05 MED ORDER — CHLORDIAZEPOXIDE HCL 25 MG PO CAPS
25.0000 mg | ORAL_CAPSULE | Freq: Four times a day (QID) | ORAL | Status: DC
Start: 2016-06-05 — End: 2016-06-06
  Administered 2016-06-05 (×2): 25 mg via ORAL
  Filled 2016-06-05 (×3): qty 1

## 2016-06-05 MED ORDER — ACETAMINOPHEN 325 MG PO TABS
650.0000 mg | ORAL_TABLET | Freq: Four times a day (QID) | ORAL | Status: DC | PRN
  Administered 2016-06-08: 650 mg via ORAL
  Filled 2016-06-05: qty 2

## 2016-06-05 MED ORDER — QUETIAPINE FUMARATE 200 MG PO TABS: 200.0000 mg | ORAL_TABLET | Freq: Every day | ORAL | Status: DC

## 2016-06-05 MED ORDER — ALUM & MAG HYDROXIDE-SIMETH 200-200-20 MG/5ML PO SUSP: 30.0000 mL | ORAL | Status: DC | PRN

## 2016-06-05 MED ORDER — ESCITALOPRAM OXALATE 10 MG PO TABS
20.0000 mg | ORAL_TABLET | Freq: Every day | ORAL | Status: DC
  Administered 2016-06-06: 20 mg via ORAL
  Filled 2016-06-05: qty 2

## 2016-06-05 MED ORDER — LORAZEPAM 2 MG PO TABS: 2.0000 mg | ORAL_TABLET | Freq: Four times a day (QID) | ORAL | Status: DC | PRN

## 2016-06-05 MED ORDER — QUETIAPINE FUMARATE 200 MG PO TABS
400.0000 mg | ORAL_TABLET | Freq: Every day | ORAL | Status: DC
  Administered 2016-06-05 – 2016-06-08 (×4): 400 mg via ORAL
  Filled 2016-06-05 (×4): qty 2

## 2016-06-05 MED ORDER — NICOTINE 21 MG/24HR TD PT24: 21.0000 mg | MEDICATED_PATCH | Freq: Every day | TRANSDERMAL | Status: DC

## 2016-06-05 MED ORDER — HALOPERIDOL 5 MG PO TABS: 5.0000 mg | ORAL_TABLET | Freq: Four times a day (QID) | ORAL | Status: DC | PRN

## 2016-06-05 MED ORDER — IBUPROFEN 600 MG PO TABS
600.0000 mg | ORAL_TABLET | Freq: Once | ORAL | Status: AC
  Administered 2016-06-05: 600 mg via ORAL
  Filled 2016-06-05: qty 1

## 2016-06-05 MED ORDER — PANTOPRAZOLE SODIUM 40 MG PO TBEC
40.0000 mg | DELAYED_RELEASE_TABLET | Freq: Every day | ORAL | Status: DC
  Administered 2016-06-06 – 2016-06-09 (×4): 40 mg via ORAL
  Filled 2016-06-05 (×4): qty 1

## 2016-06-05 MED ORDER — DIPHENHYDRAMINE HCL 25 MG PO CAPS: 50.0000 mg | ORAL_CAPSULE | Freq: Four times a day (QID) | ORAL | Status: DC | PRN

## 2016-06-05 NOTE — BHH Group Notes (Signed)
B[facility] Group Notes:  (Nursing/MHT/Case Management/Adjunct)  Date:  06/05/2016  Time:  5:33 PM  Type of Therapy:  Psychoeducational Skills  Participation Level:  Active  Participation Quality:  Appropriate, Attentive, Sharing and Supportive  Affect:  Appropriate  Cognitive:  Appropriate  Insight:  Appropriate  Engagement in Group:  Engaged and Supportive  Modes of Intervention:  Activity, Discussion and Education  Summary of Progress/Problems:  Twanna Hymanda C Nechelle Petrizzo 06/05/2016, 5:33 PM

## 2016-06-05 NOTE — Progress Notes (Signed)
[facility] by Dr. Jennet MaduroPucilowska .  Attending Physician will be Dr. Jennet MaduroPucilowska.   Patient has been [facility] to room 304, [facility] Nurse Marylu LundJanet.   Intake Paper Work has been signed and placed on patient chart.  ER staff is aware of the admission [facility]( [facility].; ER MD; Amy Patient's Nurse & Angelique Blonderenise Patient Access).  06/05/2016 Cheryl FlashNicole Shanta Dorvil, MS, NCC, LPCA Therapeutic Triage Specialist

## 2016-06-05 NOTE — ED Notes (Signed)
[facility] care.

## 2016-06-05 NOTE — ED Provider Notes (Signed)
-----------------------------------------   6:45 AM on 06/05/2016 -----------------------------------------   Blood pressure 129/83, pulse 81, temperature 98.2 F (36.8 C), temperature source Oral, resp. rate 18, height 5\' 4"  (1.626 m), weight 176 lb (79.833 kg), SpO2 98 %.  The patient had no acute events since last update.  Calm and cooperative at this time.  Disposition is pending per Psychiatry/Behavioral Medicine team recommendations.     Irean HongJade J Oluwatobi Visser, MD 06/05/16 [phone]

## 2016-06-05 NOTE — Tx Team (Signed)
Initial Interdisciplinary Treatment Plan   PATIENT STRESSORS: Health problems Mother passed away in May    PATIENT STRENGTHS: Capable of independent living Communication skills   PROBLEM LIST: Problem List/Patient Goals Date to be addressed Date deferred Reason deferred Estimated date of resolution  Depression  7/10           Loss of Mother  687/10                                          DISCHARGE CRITERIA:  Ability to meet basic life and health needs Improved stabilization in mood, thinking, and/or behavior Motivation to continue treatment in a less acute level of care  PRELIMINARY DISCHARGE PLAN: Outpatient therapy Return to previous living arrangement  PATIENT/FAMIILY INVOLVEMENT: This treatment plan has been presented to and reviewed with the patient, Linda Banks, and/or family member, .  The patient and family have been given the opportunity to ask questions and make suggestions.  Ignacia FellingJennifer A Thorvald Orsino 06/05/2016, 2:34 PM

## 2016-06-05 NOTE — [facility] Suicide Risk Assessment (Signed)
[facility]   Nursing information obtained from:    Demographic factors:    Current Mental Status:    Loss Factors:    Historical Factors:    Risk Reduction Factors:     Total Time spent with patient: 1 hour Principal Problem: <principal problem not specified> Diagnosis:   Patient Active Problem List   Diagnosis Date Noted  . Schizoaffective disorder, bipolar type (HCC) [F25.0] 06/05/2016  . Bipolar I disorder, most recent episode (or current) depressed, severe, specified as with psychotic behavior (HCC) [F31.5] 06/03/2016  . Suicidal ideation [R45.851] 06/03/2016  . GERD (gastroesophageal reflux disease) [K21.9] 06/03/2016  . Tobacco use disorder [F17.200] 06/03/2016  . Sedative, hypnotic or anxiolytic use disorder, severe, dependence (HCC) [F13.20] 06/03/2016   Subjective Data: Depression, suicidal ideation, agitation.  Continued Clinical Symptoms:    The "Alcohol Use Disorders Identification Test", Guidelines for Use in Primary Care, Second Edition.  World Science writerHealth Organization [facility](WHO). Score between 0-7:  no or low risk or alcohol related problems. Score between 8-15:  moderate risk of alcohol related problems. Score between 16-19:  high risk of alcohol related problems. Score 20 or above:  warrants further diagnostic evaluation for alcohol dependence and treatment.   CLINICAL FACTORS:   Severe Anxiety and/or Agitation Bipolar Disorder:   Depressive phase Depression:   Impulsivity Insomnia   Musculoskeletal: Strength & Muscle Tone: within normal limits Gait & Station: normal Patient leans: N/A  Psychiatric Specialty Exam: Physical Exam  Nursing note and vitals reviewed.   Review of Systems  Psychiatric/Behavioral: Positive for depression and hallucinations. The patient is nervous/anxious and has insomnia.   All other systems reviewed and are negative.   There were no vitals taken for this visit.There is no weight on file to calculate BMI.   General Appearance: Casual  Eye Contact:  Good  Speech:  Clear and Coherent  Volume:  Normal  Mood:  Depressed, Hopeless and Worthless  Affect:  Blunt  Thought Process:  Goal Directed  Orientation:  Full (Time, Place, and Person)  Thought Content:  Hallucinations: Auditory  Suicidal Thoughts:  Yes.  with intent/plan  Homicidal Thoughts:  No  Memory:  Immediate;   Fair Recent;   Fair Remote;   Fair  Judgement:  Fair  Insight:  Shallow  Psychomotor Activity:  Decreased  Concentration:  Concentration: Fair and Attention Span: Fair  Recall:  [employer]Fair  Fund of Knowledge:  Fair  Language:  Fair  Akathisia:  No  Handed:  Right  AIMS (if indicated):     Assets:  Communication Skills Desire for Improvement Housing Resilience Social Support  ADL's:  Intact  Cognition:  WNL  Sleep:         COGNITIVE FEATURES THAT CONTRIBUTE TO RISK:  None    SUICIDE RISK:   Moderate:  Frequent suicidal ideation with limited intensity, and duration, some specificity in terms of plans, no associated intent, good self-control, limited dysphoria/symptomatology, some risk factors present, and identifiable protective factors, including available and accessible social support.  PLAN OF CARE: Hospital admission, medication management, discharge planning.  Linda Banks is a 60 year old female with a history of bipolar disorder admitted for worsening of depression, psychosis, and suicidal ideation in the context of major loss and severe social stressors.  1. Agitation. This has resolved.  2. Suicidal ideation. The patient is able to contract for safety in the hospital.  3. Mood and psychosis. She was restarted on a combination of Seroquel and Lexapro that were helpful in  the past for psychosis, depression, and mood stabilization.  4. Insomnia. She is on trazodone.  5. Smoking. Nicotine patch is available.  6. GERD. She is on Protonix.  7. Benzodiazepine use. The patient was prescribed 2 mg of Xanax 3  times a day in [place]. We offered brief Librium taper.  8. Disposition. The patient will be discharged with her family. She will follow up with [facility].    I certify that inpatient services furnished can reasonably be expected to improve the patient's condition.   Kristine Linea, MD 06/05/2016, 12:00 PM

## 2016-06-05 NOTE — H&P (Signed)
Psychiatric Admission Assessment Adult  Patient Identification: Linda Banks MRN:  [id number] Date of Evaluation:  06/05/2016 Chief Complaint:  bipolar disorder 1 Principal Diagnosis: Bipolar I disorder, most recent episode (or current) depressed, severe, specified as with psychotic behavior (HCC) Diagnosis:   Patient Active Problem List   Diagnosis Date Noted  . Cocaine use disorder, moderate, dependence (HCC) [F14.20] 06/05/2016  . Bipolar I disorder, most recent episode (or current) depressed, severe, specified as with psychotic behavior (HCC) [F31.5] 06/03/2016  . Suicidal ideation [R45.851] 06/03/2016  . GERD (gastroesophageal reflux disease) [K21.9] 06/03/2016  . Tobacco use disorder [F17.200] 06/03/2016  . Sedative, hypnotic or anxiolytic use disorder, severe, dependence (HCC) [F13.20] 06/03/2016   History of Present Illness:   Identifying data. Ms. Linda Banks is a 60 y.o. female with a history of bipolar depression.  Chief complaint. "I am very sad."  History of present illness. Information was obtained from the patient and the chart. The patient has a long history of bipolar illness with multiple psychiatric hospitalizations. She was doing well on a combination of Lexapro and Seroquel prescribed by her providers in [place]. In addition she was prescribed Xanax 2 mg 3 times a day and narcotic painkillers. In 2023-04-12 her mother passed away. The patient moved in with her mother to take care of her but following her and another family member clearing the house and the patient was forced to come to [place] to stay with a relative. She came here 3 days ago and has been off medications as she does not establish care in [place] as of yet. She became increasingly depressed with poor sleep, decreased appetite, anhedonia, feeling of guilt and hopelessness worthlessness, poor energy and concentration, social isolation, crying spells, and suicidal ideation. The patient denies psychotic symptoms  on initial evaluation by today in the emergency room she became very agitated loud and hallucinating. The patient complains of heightened anxiety possibly due to Xanax withdrawal. She denies alcohol or illicit substance use but is positive for benzodiazepines and cocaine on admission.  Past psychiatric history. Reports 4 or 5 hospitalizations mostly in [place]. She has been tried on multiple medications but like Seroquel the most. He attempted suicide by cutting 10 years ago.  Family psychiatric history. According to the patient everybody has mental illness.  Social history. She plans to stay in [place]. She has no health insurance.   Total Time spent with patient: 45 minutes  Is the patient at risk to self? Yes.    Has the patient been a risk to self in the past 6 months? No.  Has the patient been a risk to self within the distant past? Yes.    Is the patient a risk to others? No.  Has the patient been a risk to others in the past 6 months? No.  Has the patient been a risk to others within the distant past? No.   Prior Inpatient Therapy:   Prior Outpatient Therapy:    Alcohol Screening:   Substance Abuse History in the last 12 months:  Yes.   Consequences of Substance Abuse: Negative Previous Psychotropic Medications: Yes  Psychological Evaluations: No  Past Medical History:  Past Medical History  Diagnosis Date  . Bipolar 1 disorder (HCC)   . Schizophrenia (HCC)   . Diabetes mellitus without complication (HCC)   . Hypertension   . Hyperlipidemia   . Bronchitis     Past Surgical History  Procedure Laterality Date  . Tubal ligation     Family  History: History reviewed. No pertinent family history.   Tobacco Screening: [phone])::1)@ Social History:  History  Alcohol Use No     History  Drug Use No    Additional Social History:                           Allergies:  No Known Allergies Lab Results: No results found for this or any previous  visit (from the past 48 hour(s)).  Blood Alcohol level:  Lab Results  Component Value Date   ETH <5 06/02/2016    Metabolic Disorder Labs:  No results found for: HGBA1C, MPG No results found for: PROLACTIN No results found for: CHOL, TRIG, HDL, CHOLHDL, VLDL, LDLCALC  Current Medications: Current Facility-Administered Medications  Medication Dose Route Frequency Provider Last Rate Last Dose  . acetaminophen (TYLENOL) tablet 650 mg  650 mg Oral Q6H PRN Yamaira Spinner B Makaelyn Aponte, MD      . alum & mag hydroxide-simeth (MAALOX/MYLANTA) 200-200-20 MG/5ML suspension 30 mL  30 mL Oral Q4H PRN Louetta Hollingshead B Lainie Daubert, MD      . chlordiazePOXIDE (LIBRIUM) capsule 25 mg  25 mg Oral QID Shari Prows, MD      . Melene Muller ON 06/06/2016] escitalopram (LEXAPRO) tablet 20 mg  20 mg Oral Daily Balian Schaller B Isaly Fasching, MD      . ibuprofen (ADVIL,MOTRIN) tablet 600 mg  600 mg Oral Once Jodye Scali B Hendrik Donath, MD      . magnesium hydroxide (MILK OF MAGNESIA) suspension 30 mL  30 mL Oral Daily PRN Shari Prows, MD      . Melene Muller ON 06/06/2016] nicotine (NICODERM CQ - dosed in mg/24 hours) patch 21 mg  21 mg Transdermal Daily Sadia Belfiore B Alejah Aristizabal, MD      . Melene Muller ON 06/06/2016] pantoprazole (PROTONIX) EC tablet 40 mg  40 mg Oral Daily Tanasha Menees B Maelle Sheaffer, MD      . QUEtiapine (SEROQUEL) tablet 200 mg  200 mg Oral QHS Destini Cambre B Giovani Neumeister, MD      . traZODone (DESYREL) tablet 100 mg  100 mg Oral QHS Henli Hey B Razia Screws, MD       PTA Medications: Prescriptions prior to admission  Medication Sig Dispense Refill Last Dose  . escitalopram (LEXAPRO) 20 MG tablet Take 20 mg by mouth 2 (two) times daily.     Marland Kitchen esomeprazole (NEXIUM) 40 MG capsule Take 40 mg by mouth daily at 12 noon.     . hydrOXYzine (VISTARIL) 25 MG capsule Take 25 mg by mouth 3 (three) times daily.     . QUEtiapine (SEROQUEL XR) 300 MG 24 hr tablet Take 300 mg by mouth at bedtime.     Marland Kitchen zolpidem (AMBIEN) 10 MG tablet Take 10 mg by mouth at  bedtime as needed for sleep.        Musculoskeletal: Strength & Muscle Tone: within normal limits Gait & Station: normal Patient leans: N/A  Psychiatric Specialty Exam: Physical Exam  Nursing note and vitals reviewed. Constitutional: She is oriented to person, place, and time. She appears well-developed and well-nourished.  HENT:  Head: Normocephalic and atraumatic.  Eyes: Conjunctivae and EOM are normal. Pupils are equal, round, and reactive to light.  Neck: Normal range of motion. Neck supple.  Cardiovascular: Normal rate, regular rhythm and normal heart sounds.   Respiratory: Effort normal and breath sounds normal.  GI: Soft. Bowel sounds are normal.  Musculoskeletal: Normal range of motion.  Neurological: She is alert and oriented to person,  place, and time.  Skin: Skin is warm and dry.    Review of Systems  Psychiatric/Behavioral: Positive for depression, suicidal ideas, hallucinations and substance abuse. The patient is nervous/anxious.   All other systems reviewed and are negative.   There were no vitals taken for this visit.There is no weight on file to calculate BMI.  See SRA.                                                         Treatment Plan Summary: Daily contact with patient to assess and evaluate symptoms and progress in treatment and Medication management   Ms. Gillentine is a 60 year old female with a history of bipolar disorder admitted for worsening of depression, psychosis, and suicidal ideation in the context of major loss and severe social stressors.  1. Agitation. This has resolved. I discontinue Haldol, Benadryl, Ativan order.  2. Suicidal ideation. The patient is able to contract for safety in the hospital.  3. Mood and psychosis. She was restarted on a combination of Seroquel and Lexapro that were helpful in the past for psychosis, depression, and mood stabilization. We will continue Seroquel titration.   4. Insomnia. She is  on trazodone.  5. Smoking. Nicotine patch is available.  6. GERD. She is on Protonix.  7. Benzodiazepine use. The patient was reportedly prescribed 2 mg of [place]ax 3 times a day [place]. We offered brief Librium taper.  8. Metabolic syndrome monitoring. Lipid panel, TSH, hemoglobin A1c and prolactin are pending.   9. Substance abuse treatment. The patient is positive for cocaine but denies ever using. She is not interested in substance abuse treatment.   10. Disposition. The patient will be discharged with her family. She will follow up with [facility].  Observation Level/Precautions:  15 minute checks  Laboratory:  CBC Chemistry Profile UDS UA  Psychotherapy:    Medications:    Consultations:    Discharge Concerns:    Estimated LOS:  Other:     I certify that inpatient services furnished can reasonably be expected to improve the patient's condition.    Kristine LineaJolanta Tira Lafferty, MD 7/10/201712:58 PM

## 2016-06-05 NOTE — Plan of Care (Signed)
Problem: Education: Goal: Knowledge of Linda Banks General Education information/materials will improve Outcome: Progressing Cooperative with admission and evaluation.

## 2016-06-05 NOTE — [facility] Group Notes (Signed)
[facility] Group Notes:  (Nursing/MHT/Case Management/Adjunct)  Date:  06/05/2016  Time:  9:18 PM  Type of Therapy:  Group Therapy  Participation Level:  Active  Participation Quality:  Appropriate  Affect:  Appropriate  Cognitive:  Appropriate  Insight:  Appropriate  Engagement in Group:  Engaged  Modes of Intervention:  Discussion  Summary of Progress/Problems:  Linda Banks Linda Banks 06/05/2016, 9:18 PM

## 2016-06-05 NOTE — ED Notes (Signed)

## 2016-06-05 NOTE — [facility] Group Notes (Addendum)
[facility] LCSW Group Therapy   06/05/2016 1pm Type of Therapy: Group Therapy   Participation Level: Active   Participation Quality: Attentive, Sharing and Supportive   Affect: Depressed and Flat   Cognitive: Alert and Oriented   Insight: Developing/Improving and Engaged   Engagement in Therapy: Developing/Improving and Engaged   Modes of Intervention: Clarification, Confrontation, Discussion, Education, Exploration,  Limit-setting, Orientation, Problem-solving, Rapport Building, Dance movement psychotherapisteality Testing, Socialization and Support   Summary of Progress/Problems: Pt identified obstacles faced currently and processed barriers involved in overcoming these obstacles. Pt identified steps necessary for overcoming these obstacles and explored motivation (internal and external) for facing these difficulties head on. Pt further identified one area of concern in their lives and chose a goal to focus on for today. Pt shared the pt hopes to find recovery with the pt's mental health issues and identifies that getting proper assistance from doctors in this matter are an obstacle for the pt's recovery.  Pt shared the steps that the pt would need to take in order to avoid being hospitalized again and identified that taking the pt's medications as prescribed is key in avoiding being re-admitted.  Pt reported she is choosing to focus on avoiding conflict with others and that the pt believes this will assist the pt in the future. Pt was polite and cooperative with the CSW and other group members and focused and attentive to the topics discussed and the sharing of others.  Dorothe PeaJonathan F. Neveyah Garzon, LCSWA, LCAS

## 2016-06-05 NOTE — H&P (Deleted)
Psychiatric Admission Assessment Adult  Patient Identification: Linda Banks MRN:  [id number] Date of Evaluation:  06/05/2016 Chief Complaint:  bipolar disorder 1 Principal Diagnosis: <principal problem not specified> Diagnosis:   Patient Active Problem List   Diagnosis Date Noted  . Schizoaffective disorder, bipolar type (HCC) [F25.0] 06/05/2016  . Bipolar I disorder, most recent episode (or current) depressed, severe, specified as with psychotic behavior (HCC) [F31.5] 06/03/2016  . Suicidal ideation [R45.851] 06/03/2016  . GERD (gastroesophageal reflux disease) [K21.9] 06/03/2016  . Tobacco use disorder [F17.200] 06/03/2016  . Sedative, hypnotic or anxiolytic use disorder, severe, dependence (HCC) [F13.20] 06/03/2016   History of Present Illness:   Identifying data. Linda Banks is a 60 y.o. female with a history of bipolar depression.  Chief complaint. "I am very sad."  History of present illness. Information was obtained from the patient and the chart. The patient has a long history of bipolar illness with multiple psychiatric hospitalizations. She was doing well on a combination of Lexapro and Seroquel prescribed by her providers in [place]. In addition she was prescribed Xanax 2 mg 3 times a day and narcotic painkillers. In Apr 16, 2023 her mother passed away. The patient moved in with her mother to take care of her but following her and another family member clearing the house and the patient was forced to come to [place] to stay with a relative. She came here 3 days ago and has been off medications as she does not establish care in [place] as of yet. She became increasingly depressed with poor sleep, decreased appetite, anhedonia, feeling of guilt and hopelessness worthlessness, poor energy and concentration, social isolation, crying spells, and suicidal ideation. The patient denies psychotic symptoms on initial evaluation by today in the emergency room she became very agitated loud  and hallucinating. The patient complains of heightened anxiety possibly due to Xanax withdrawal. She denies alcohol or illicit substance use.  Past psychiatric history. Reports 4 or 5 hospitalizations mostly in [place]. She has been tried on multiple medications but like Seroquel the most. He attempted suicide by cutting 10 years ago.  Family psychiatric history. According to the patient everybody has mental illness.  Social history. She plans to stay in [place].  Total Time spent with patient: 1 hour  Is the patient at risk to self? Yes.    Has the patient been a risk to self in the past 6 months? No.  Has the patient been a risk to self within the distant past? Yes.    Is the patient a risk to others? No.  Has the patient been a risk to others in the past 6 months? No.  Has the patient been a risk to others within the distant past? No.   Prior Inpatient Therapy:   Prior Outpatient Therapy:    Alcohol Screening:   Substance Abuse History in the last 12 months:  No. Consequences of Substance Abuse: NA Previous Psychotropic Medications: Yes  Psychological Evaluations: No  Past Medical History:  Past Medical History  Diagnosis Date  . Bipolar 1 disorder (HCC)   . Schizophrenia (HCC)   . Diabetes mellitus without complication (HCC)   . Hypertension   . Hyperlipidemia   . Bronchitis     Past Surgical History  Procedure Laterality Date  . Tubal ligation     Family History: History reviewed. No pertinent family history.  Tobacco Screening: @FLOW [phone])::1)@ Social History:  History  Alcohol Use No     History  Drug Use No  Additional Social History:                           Allergies:  No Known Allergies Lab Results: No results found for this or any previous visit (from the past 48 hour(s)).  Blood Alcohol level:  Lab Results  Component Value Date   ETH <5 06/02/2016    Metabolic Disorder Labs:  No results found for: HGBA1C, MPG No  results found for: PROLACTIN No results found for: CHOL, TRIG, HDL, CHOLHDL, VLDL, LDLCALC  Current Medications: Current Facility-Administered Medications  Medication Dose Route Frequency Provider Last Rate Last Dose  . acetaminophen (TYLENOL) tablet 650 mg  650 mg Oral Q6H PRN Kathlen Sakurai B Jackston [place], MD      . alum & mag hydroxide-simeth (MAALOX/MYLANTA) 200-200-20 MG/5ML suspension 30 mL  30 mL Oral Q4H PRN Yaser Harvill B Bertha Lokken, MD      . chlordiazePOXIDE (LIBRIUM) capsule 25 mg  25 mg Oral QID Shari Prows, MD      . Melene Muller ON 06/06/2016] escitalopram (LEXAPRO) tablet 20 mg  20 mg Oral Daily Lillien Petronio B Zailyn Thoennes, MD      . ibuprofen (ADVIL,MOTRIN) tablet 600 mg  600 mg Oral Once Aine Strycharz B Zhania Shaheen, MD      . magnesium hydroxide (MILK OF MAGNESIA) suspension 30 mL  30 mL Oral Daily PRN Shari Prows, MD      . Melene Muller ON 06/06/2016] nicotine (NICODERM CQ - dosed in mg/24 hours) patch 21 mg  21 mg Transdermal Daily Tayanna Talford B Jemmie Rhinehart, MD      . Melene Muller ON 06/06/2016] pantoprazole (PROTONIX) EC tablet 40 mg  40 mg Oral Daily Leyla Soliz B Hajira Verhagen, MD      . QUEtiapine (SEROQUEL) tablet 200 mg  200 mg Oral QHS Huber Mathers B Gemayel Mascio, MD      . traZODone (DESYREL) tablet 100 mg  100 mg Oral QHS Chimene Salo B Katlyne Nishida, MD       PTA Medications: Prescriptions prior to admission  Medication Sig Dispense Refill Last Dose  . escitalopram (LEXAPRO) 20 MG tablet Take 20 mg by mouth 2 (two) times daily.     Marland Kitchen esomeprazole (NEXIUM) 40 MG capsule Take 40 mg by mouth daily at 12 noon.     . hydrOXYzine (VISTARIL) 25 MG capsule Take 25 mg by mouth 3 (three) times daily.     . QUEtiapine (SEROQUEL XR) 300 MG 24 hr tablet Take 300 mg by mouth at bedtime.     Marland Kitchen zolpidem (AMBIEN) 10 MG tablet Take 10 mg by mouth at bedtime as needed for sleep.        Musculoskeletal: Strength & Muscle Tone: within normal limits Gait & Station: normal Patient leans: N/A  Psychiatric Specialty Exam: Physical Exam   Nursing note and vitals reviewed. Constitutional: She is oriented to person, place, and time. She appears well-developed and well-nourished.  HENT:  Head: Normocephalic and atraumatic.  Eyes: Conjunctivae and EOM are normal. Pupils are equal, round, and reactive to light.  Neck: Normal range of motion. Neck supple.  Cardiovascular: Normal rate, regular rhythm and normal heart sounds.   Respiratory: Effort normal and breath sounds normal.  GI: Soft. Bowel sounds are normal.  Musculoskeletal: Normal range of motion.  Neurological: She is alert and oriented to person, place, and time.  Skin: Skin is warm and dry.    Review of Systems  Psychiatric/Behavioral: Positive for depression, suicidal ideas and hallucinations.  All other systems reviewed and are  negative.   There were no vitals taken for this visit.There is no weight on file to calculate BMI.  See SRA.                                                         Treatment Plan Summary: Daily contact with patient to assess and evaluate symptoms and progress in treatment and Medication management   Linda Banks is a 60 year old female with a history of bipolar disorder admitted for worsening of depression, psychosis, and suicidal ideation in the context of major loss and severe social stressors.  1. Agitation. This has resolved. I discontinue Haldol, Benadryl, Ativan order.  2. Suicidal ideation. The patient is able to contract for safety in the hospital.  3. Mood and psychosis. She was restarted on a combination of Seroquel and Lexapro that were helpful in the past for psychosis, depression, and mood stabilization. We will continue Seroquel titration.   4. Insomnia. She is on trazodone.  5. Smoking. Nicotine patch is available.  6. GERD. She is on Protonix.  7. Benzodiazepine use. The patient was prescribed 2 mg of [place]ax 3 times a day in [place]. We offered brief Librium taper.  8. Metabolic syndrome  monitoring. Lipid panel, TSH, hemoglobin A1c and prolactin are pending.   9. Disposition. The patient will be discharged with her family. She will follow up with [facility].   Observation Level/Precautions:  15 minute checks  Laboratory:  CBC Chemistry Profile UDS UA  Psychotherapy:    Medications:    Consultations:    Discharge Concerns:    Estimated LOS:  Other:     I certify that inpatient services furnished can reasonably be expected to improve the patient's condition.    Kristine LineaJolanta Turki Tapanes, MD 7/10/201712:05 PM

## 2016-06-05 NOTE — ED Notes (Signed)
Patient taking a shower. Maintained on 15 minute checks and observation by security camera for safety. 

## 2016-06-05 NOTE — BHH Group Notes (Signed)
[facility] LCSW Group Therapy   06/05/2016  9:30 AM  Type of Therapy: Group Therapy   Participation Level: Did Not Attend. Patient invited to participate but declined.    Cynde Menard F. Chau Sawin, MSW, LCSWA, LCAS     

## 2016-06-05 NOTE — Progress Notes (Signed)
Patient with depressed affect, cooperative behavior with meals, meds and plan of care. No SI/HI at this time. Reports her Mom died and she has increasing depression. [place] living [place] came to [place] to get away. Patient on Librium and states she does not want to come off of Percocet (for chronic back ache) and Xanax (for anxiety). Skin check with no wounds or bruises. Skin check with no contraband. Safety maintained.

## 2016-06-05 NOTE — ED Notes (Signed)
Patient awake, alert, and oriented. She denies SI. Patient reports she does not feel as depressed as yesterday. Affect a little brighter. No evidence of psychotic thinking. Patient was given a meal tray.

## 2016-06-06 LAB — LIPID PANEL
Cholesterol: 224 mg/dL — ABNORMAL HIGH (ref 0–200)
HDL: 56 mg/dL (ref >40)
LDL CALC: 134 mg/dL — AB (ref 0–99)
TRIGLYCERIDES: 172 mg/dL — AB (ref <150)
Total CHOL/HDL Ratio: 4 RATIO
VLDL: 34 mg/dL (ref 0–40)

## 2016-06-06 LAB — T4, FREE: Free T4: 0.71 ng/dL (ref 0.61–1.12)

## 2016-06-06 LAB — HEMOGLOBIN A1C: Hgb A1c MFr Bld: 6 % (ref 4.0–6.0)

## 2016-06-06 LAB — TSH: TSH: 0.209 u[IU]/mL — ABNORMAL LOW (ref 0.350–4.500)

## 2016-06-06 MED ORDER — VENLAFAXINE HCL ER 75 MG PO CP24
150.0000 mg | ORAL_CAPSULE | Freq: Every day | ORAL | Status: DC
  Administered 2016-06-07 – 2016-06-09 (×3): 150 mg via ORAL
  Filled 2016-06-06 (×3): qty 2

## 2016-06-06 MED ORDER — ZOLPIDEM TARTRATE 5 MG PO TABS
5.0000 mg | ORAL_TABLET | Freq: Every evening | ORAL | Status: DC | PRN
  Administered 2016-06-06 – 2016-06-08 (×3): 5 mg via ORAL
  Filled 2016-06-06 (×3): qty 1

## 2016-06-06 MED ORDER — CHLORDIAZEPOXIDE HCL 10 MG PO CAPS
10.0000 mg | ORAL_CAPSULE | Freq: Four times a day (QID) | ORAL | Status: AC
  Administered 2016-06-06: 10 mg via ORAL
  Filled 2016-06-06: qty 1

## 2016-06-06 NOTE — BHH Counselor (Signed)
Child/Adolescent Comprehensive Assessment  Patient ID: Linda Banks, female   DOB: 1956/03/21, 60 y.o.   MRN: [id number]  Information Source:    Living Environment/Situation:  Living Arrangements: Other (Comment) (moving in with first cousin here in [place]) Living conditions (as described by patient or guardian): good, supportive How long has patient lived in current situation?: back and forth for several months/years What is atmosphere in current home: Comfortable, Loving, Supportive  Family of Origin: By whom was/is the patient raised?: Mother  Issues from Childhood Impacting Current Illness:  loss of her mother in May  Siblings: 1 sibling, brother in [place]. Marital and Family Relationships: Marital status: Single Did patient suffer any verbal/emotional/physical/sexual abuse as a child?: No Did patient suffer from severe childhood neglect?: No Was the patient ever a victim of a crime or a disaster?: No  Social Support System:  family and friends  Leisure/Recreation:  travel  Family Assessment:  close to her cousin and her children. They are supportive of her move.  Spiritual Assessment and Cultural Influences:    Education Status:    Employment/Work Situation: Employment situation: On disability Has patient ever been in the [employer] and Companymilitary?: No Has patient ever served in combat?: No Did You Receive Any Psychiatric Treatment/Services While in Equities traderthe Military?: No Are There Guns or Other Weapons in Your Home?: No Are These Weapons Safely Secured?: No  Legal History (Arrests, DWI;s, Technical sales engineerrobation/Parole, Pending Charges): Has alcohol/substance abuse ever caused legal problems?: No  High Risk Psychosocial Issues Requiring Early Treatment Planning and Intervention:  None  Integrated Summary. Recommendations, and Anticipated Outcomes:  None  Identified Problems: Does patient have access to transportation?: Yes Does patient have financial barriers related to discharge  medications?: No  Risk to Self: Is patient at risk for suicide?: No  Risk to Others:  none  Family History of Physical and Psychiatric Disorders:  None  History of Drug and Alcohol Use:  Denies  History of Previous Treatment or Community Mental Health Resources Used:  several hospitalizations in [place] for depression.  Glennon MacLaws, Javonnie Illescas P, 06/06/2016, MSW, LCSW

## 2016-06-06 NOTE — B[facility] Group Notes (Signed)
Goals Group Date/Time: 06/06/16 9am Type of Therapy and Topic: Group Therapy: Goals Group: SMART Goals   Participation Level: Moderate  Description of Group:    The purpose of a daily goals group is to assist and guide patients in setting recovery/wellness-related goals. The objective is to set goals as they relate to the crisis in which they were admitted. Patients will be using SMART goal modalities to set measurable goals. Characteristics of realistic goals will be discussed and patients will be assisted in setting and processing how one will reach their goal. Facilitator will also assist patients in applying interventions and coping skills learned in psycho-education groups to the SMART goal and process how one will achieve defined goal.   Therapeutic Goals:   -Patients will develop and document one goal related to or their crisis in which brought them into treatment.  -Patients will be guided by LCSW using SMART goal setting modality in how to set a measurable, attainable, realistic and time sensitive goal.  -Patients will process barriers in reaching goal.  -Patients will process interventions in how to overcome and successful in reaching goal.   Patient's Goal: Pt shared the pt's goal for discharge is to return home and spend time with the pt's family and grandchildren and to not let the pt's crisis affect the family adversely.  Pt shared one barrier in attaining the pt's goal is finding a doctor to manage the pt's medication effectively.  Pt shared the pt planned to find a doctor within one month of discharge.  Pt shared the pt believes this goal is measurable, as evidenced by not being re-admitted to the hospital and/or not experiencing any symptoms.       Therapeutic Modalities:  Motivational Interviewing  Research officer, political partyCognitive Behavioral Therapy  Crisis Intervention Model  SMART goals setting   Dorothe PeaJonathan F. Ligia Duguay, LCSWA, LCAS

## 2016-06-06 NOTE — B[facility] Group Notes (Signed)
[facility] Group Notes:  (Nursing/MHT/Case Management/Adjunct)  Date:  06/06/2016  Time:  2:11 PM  Type of Therapy:  Psychoeducational Skills  Participation Level:  Active  Participation Quality:  Appropriate, Attentive and Supportive  Affect:  Appropriate  Cognitive:  Appropriate  Insight:  Appropriate  Engagement in Group:  Engaged and Supportive  Modes of Intervention:  Discussion and Education  Summary of Progress/Problems:  Linda Banks 06/06/2016, 2:11 PM

## 2016-06-06 NOTE — Progress Notes (Signed)
Recreation Therapy Notes  INPATIENT RECREATION THERAPY ASSESSMENT  Patient Details Name: Linda Banks MRN: [id number] DOB: 1956/03/09 Today's Date: 06/06/2016  Patient Stressors: Relationship, Death, Other (Comment) (Break-up in March; Mother died; Home in [place] - doesn't know if she will live in [place] or [place])  Coping Skills:   Isolate, Arguments, Avoidance, Art/Dance, Music, Other (Comment) (Sleep; tries not to worry about it)  Personal Challenges: Anger, Communication, Concentration, Decision-Making, Problem-Solving, Self-Esteem/Confidence, Social Interaction, Stress Management, Trusting Others  Leisure Interests (2+):  Individual - Other (Comment) (Dancing, family dinners)  Awareness of Community Resources:  No  Community Resources:     Current Use:    If no, Barriers?:    Patient Strengths:  Caring, giving  Patient Identified Areas of Improvement:  Anger, watching her words when she gets angry  Current Recreation Participation:  Nothing  Patient Goal for Hospitalization:  To figure out where she is going to live  [place]idence:  [place]   Current SI (including self-harm):  No  Current HI:  No  Consent to Intern Participation: N/A   Jacquelynn CreeGreene,Zaylynn Rickett M, LRT/CTRS 06/06/2016, 1:21 PM

## 2016-06-06 NOTE — Plan of Care (Signed)
Problem: Coping: Goal: Ability to cope will improve Outcome: Progressing Patient attending  Unit programing  Working on coping skills.     

## 2016-06-06 NOTE — Plan of Care (Signed)
Problem: Coping: Goal: Ability to demonstrate self-control will improve Outcome: Progressing Patient able to demonstrate self control on the milieu

## 2016-06-06 NOTE — Tx Team (Signed)
Interdisciplinary Treatment Plan Update (Adult)  Date:  06/06/2016 Time Reviewed:  3:11 PM  Progress in Treatment: Attending groups: Yes. Participating in groups:  Yes. Taking medication as prescribed:  Yes. Tolerating medication:  Yes. Family/Significant othe contact made:  No, will contact:    Patient understands diagnosis:  Yes. Discussing patient identified problems/goals with staff:  No. Medical problems stabilized or resolved:  Yes. Denies suicidal/homicidal ideation: Yes. Issues/concerns per patient self-inventory:  No. Other:  New problem(s) identified: No, Describe:     Discharge Plan or Barriers:Home with relative, Follow up TBD  Reason for Continuation of Hospitalization: Anxiety Depression Hallucinations Medication stabilization  Comments:The patient has a long history of bipolar illness with multiple psychiatric hospitalizations. She was doing well on a combination of Lexapro and Seroquel prescribed by her providers in [place]. In addition she was prescribed Xanax 2 mg 3 times a day and narcotic painkillers. In 2023/05/11 her mother passed away. The patient moved in with her mother to take care of her but following her and another family member clearing the house and the patient was forced to come to [place] to stay with a relative. She came here 3 days ago and has been off medications as she does not establish care in [place] as of yet. She became increasingly depressed with poor sleep, decreased appetite, anhedonia, feeling of guilt and hopelessness worthlessness, poor energy and concentration, social isolation, crying spells, and suicidal ideation. The patient denies psychotic symptoms on initial evaluation by today in the emergency room she became very agitated loud and hallucinating. The patient complains of heightened anxiety possibly due to Xanax withdrawal. She denies alcohol or illicit substance use but is positive for benzodiazepines and cocaine on  admission.  Estimated length of stay: 3-5 days  New goal(s):  Review of initial/current patient goals per problem list:   1.  Goal(s): Patient will participate in aftercare plan * Met: NO * Target date: at discharge * As evidenced by: Patient will participate within aftercare plan AEB aftercare provider and housing plan at discharge being identified.   2.  Goal (s): Patient will exhibit decreased depressive symptoms and suicidal ideations. * Met: NO *  Target date: at discharge * As evidenced by: Patient will utilize self-rating of depression at 3 or below and demonstrate decreased signs of depression or be deemed stable for discharge by MD. 3.  Goal (s): Patient will demonstrate decreased symptoms of psychosis. * Met: NO *  Target date: at discharge As evidenced by: Patient will not endorse signs of psychosis or be deemed stable for discharge by MD. Attendees: Patient:  Cristy Hilts 7/11/20173:11 PM  Family:   7/11/20173:11 PM  Physician:  Orson Slick 7/11/20173:11 PM  Nursing:   Carolynn Sayers, RN 7/11/20173:11 PM  Case Manager:   7/11/20173:11 PM  Counselor:  Dossie Arbour, LCSW 7/11/20173:11 PM  Other:  Everitt Amber, [place] 7/11/20173:11 PM  Other:   7/11/20173:11 PM  Other:   7/11/20173:11 PM  Other:  7/11/20173:11 PM  Other:  7/11/20173:11 PM  Other:  7/11/20173:11 PM  Other:  7/11/20173:11 PM  Other:  7/11/20173:11 PM  Other:  7/11/20173:11 PM  Other:   7/11/20173:11 PM   Scribe for Treatment Team:   August Saucer, 06/06/2016, 3:11 PM, MSW, LCSW

## 2016-06-06 NOTE — Progress Notes (Signed)
[facility] MD Progress Note  06/06/2016 9:31 AM Yomaris Palecek  MRN:  [id number]  Subjective:  Ms. Melito does not report much improvement. She still feels depressed and has passing thoughts of suicide. She complains of racing thoughts and insomnia. She denies psychotic symptoms. She accepts medications and tolerates them well. There are no somatic complaints. She started participating in programming right away.  Principal Problem: Bipolar I disorder, most recent episode (or current) depressed, severe, specified as with psychotic behavior (HCC) Diagnosis:   Patient Active Problem List   Diagnosis Date Noted  . Cocaine use disorder, moderate, dependence (HCC) [F14.20] 06/05/2016  . Bipolar I disorder, most recent episode (or current) depressed, severe, specified as with psychotic behavior (HCC) [F31.5] 06/03/2016  . Suicidal ideation [R45.851] 06/03/2016  . GERD (gastroesophageal reflux disease) [K21.9] 06/03/2016  . Tobacco use disorder [F17.200] 06/03/2016  . Sedative, hypnotic or anxiolytic use disorder, severe, dependence (HCC) [F13.20] 06/03/2016   Total Time spent with patient: 20 minutes  Past Psychiatric History: Bipolar disorder.  Past Medical History:  Past Medical History  Diagnosis Date  . Bipolar 1 disorder (HCC)   . Schizophrenia (HCC)   . Diabetes mellitus without complication (HCC)   . Hypertension   . Hyperlipidemia   . Bronchitis     Past Surgical History  Procedure Laterality Date  . Tubal ligation     Family History: History reviewed. No pertinent family history. Family Psychiatric  History: See H&P. Social History:  History  Alcohol Use No     History  Drug Use No    Social History   Social History  . Marital Status: Divorced    Spouse Name: N/A  . Number of Children: N/A  . Years of Education: N/A   Social History Main Topics  . Smoking status: Current Every Day Smoker -- 1.00 packs/day    Types: Cigarettes  . Smokeless tobacco: Never Used  . Alcohol  Use: No  . Drug Use: No  . Sexual Activity: Not Asked   Other Topics Concern  . None   Social History Narrative   Additional Social History:                         Sleep: Poor  Appetite:  Fair  Current Medications: Current Facility-Administered Medications  Medication Dose Route Frequency Provider Last Rate Last Dose  . acetaminophen (TYLENOL) tablet 650 mg  650 mg Oral Q6H PRN Leontae Bostock B Zynia Wojtowicz, MD      . alum & mag hydroxide-simeth (MAALOX/MYLANTA) 200-200-20 MG/5ML suspension 30 mL  30 mL Oral Q4H PRN Vito Beg B Denesia Donelan, MD      . chlordiazePOXIDE (LIBRIUM) capsule 25 mg  25 mg Oral QID Shari Prows, MD   25 mg at 06/05/16 1746  . escitalopram (LEXAPRO) tablet 20 mg  20 mg Oral Daily Deyci Gesell B Stephane Niemann, MD   20 mg at 06/06/16 0851  . magnesium hydroxide (MILK OF MAGNESIA) suspension 30 mL  30 mL Oral Daily PRN Xara Paulding B Kaleiah Kutzer, MD      . nicotine (NICODERM CQ - dosed in mg/24 hours) patch 21 mg  21 mg Transdermal Daily Caysie Minnifield B Scottlynn Lindell, MD   21 mg at 06/06/16 0853  . pantoprazole (PROTONIX) EC tablet 40 mg  40 mg Oral Daily Shari Prows, MD   40 mg at 06/06/16 0851  . QUEtiapine (SEROQUEL) tablet 400 mg  400 mg Oral QHS Zendaya Groseclose B Marquetta Weiskopf, MD   400 mg at 06/05/16 2244  .  zolpidem (AMBIEN) tablet 5 mg  5 mg Oral QHS PRN Shari ProwsJolanta B Jiaire Rosebrook, MD        Lab Results:  Results for orders placed or performed during the hospital encounter of 06/05/16 (from the past 48 hour(s))  Lipid panel, fasting     Status: Abnormal   Collection Time: 06/06/16  6:46 AM  Result Value Ref Range   Cholesterol 224 (H) 0 - 200 mg/dL   Triglycerides 409172 (H) <150 mg/dL   HDL 56 >81>40 mg/dL   Total CHOL/HDL Ratio 4.0 RATIO   VLDL 34 0 - 40 mg/dL   LDL Cholesterol 191134 (H) 0 - 99 mg/dL    Comment:        Total Cholesterol/HDL:CHD Risk Coronary Heart Disease Risk Table                     Men   Women  1/2 Average Risk   3.4   3.3  Average Risk       5.0    4.4  2 X Average Risk   9.6   7.1  3 X Average Risk  23.4   11.0        Use the calculated Patient Ratio above and the CHD Risk Table to determine the patient's CHD Risk.        ATP III CLASSIFICATION (LDL):  <100     mg/dL   Optimal  [id number]  mg/dL   Near or Above                    Optimal  130-159  mg/dL   Borderline  [id number]  mg/dL   High  >657>190     mg/dL   Very High   TSH     Status: Abnormal   Collection Time: 06/06/16  6:46 AM  Result Value Ref Range   TSH 0.209 (L) 0.350 - 4.500 uIU/mL    Blood Alcohol level:  Lab Results  Component Value Date   ETH <5 06/02/2016    Metabolic Disorder Labs: No results found for: HGBA1C, MPG No results found for: PROLACTIN Lab Results  Component Value Date   CHOL 224* 06/06/2016   TRIG 172* 06/06/2016   HDL 56 06/06/2016   CHOLHDL 4.0 06/06/2016   VLDL 34 06/06/2016   LDLCALC 134* 06/06/2016    Physical Findings: AIMS: Facial and Oral Movements Muscles of Facial Expression: None, normal Lips and Perioral Area: None, normal Jaw: None, normal Tongue: None, normal,Extremity Movements Upper (arms, wrists, hands, fingers): None, normal Lower (legs, knees, ankles, toes): None, normal,  , Overall Severity Severity of abnormal movements (highest score from questions above): None, normal Patient's awareness of abnormal movements (rate only patient's report): No Awareness, Dental Status Current problems with teeth and/or dentures?: No Does patient usually wear dentures?: No  CIWA:    COWS:     Musculoskeletal: Strength & Muscle Tone: within normal limits Gait & Station: normal Patient leans: N/A  Psychiatric Specialty Exam: Physical Exam  Nursing note and vitals reviewed.   Review of Systems  Psychiatric/Behavioral: Positive for depression and suicidal ideas. The patient has insomnia.   All other systems reviewed and are negative.   Blood pressure 143/79, pulse 87, temperature 98.4 F (36.9 C), temperature source  Oral, resp. rate 18, height 5\' 4"  (1.626 m), weight 77.565 kg (171 lb), SpO2 100 %.Body mass index is 29.34 kg/(m^2).  General Appearance: Casual  Eye Contact:  Good  Speech:  Slow  Volume:  Normal  Mood:  Depressed  Affect:  Flat  Thought Process:  Goal Directed  Orientation:  Full (Time, Place, and Person)  Thought Content:  WDL  Suicidal Thoughts:  Yes.  with intent/plan  Homicidal Thoughts:  No  Memory:  Immediate;   Fair Recent;   Fair Remote;   Fair  Judgement:  Poor  Insight:  Shallow  Psychomotor Activity:  Decreased  Concentration:  Concentration: Fair and Attention Span: Fair  Recall:  [employer] of Knowledge:  Fair  Language:  Fair  Akathisia:  No  Handed:  Right  AIMS (if indicated):     Assets:  Communication Skills Desire for Improvement Financial Resources/Insurance Housing Physical Health Resilience Social Support  ADL's:  Intact  Cognition:  WNL  Sleep:  Number of Hours: 7     Treatment Plan Summary: Daily contact with patient to assess and evaluate symptoms and progress in treatment and Medication management   Ms. Cabello is a 60 year old female with a history of bipolar disorder admitted for worsening of depression, psychosis, and suicidal ideation in the context of major loss and severe social stressors.  1. Agitation. This has resolved. I discontinued Haldol, Benadryl, Ativan order.  2. Suicidal ideation. The patient is able to contract for safety in the hospital.  3. Mood and psychosis. She was restarted on a combination of Seroquel and Lexapro that were helpful in the past for psychosis, depression, and mood stabilization. We continue Seroquel at 400 mg. I will switch her to Effexor.    4. Insomnia. We will discontinue trazodone and start Ambien.  5. Smoking. Nicotine patch is available.  6. GERD. She is on Protonix.  7. Benzodiazepine use. The patient was reportedly prescribed 2 mg of Xanax 3 times a day in [place]. We will lowe Librium to  10 mg for 2 more doses.   8. Metabolic syndrome monitoring. Lipid panel is slightly elevated, TSH is low. We will order Fee T4. Hemoglobin A1c and prolactin are pending.   9. Substance abuse treatment. The patient is positive for cocaine but denies ever using. She is not interested in substance abuse treatment.   10. Disposition. The patient will be discharged with her family. She will follow up with [facility].  Kristine Linea, MD 06/06/2016, 9:31 AM

## 2016-06-06 NOTE — Progress Notes (Signed)
D: Patient stated slept fair last night .Stated appetite is fair and energy level  Ilow. Stated concentration is good . Stated on Depression scale 8 , hopeless 5 and anxiety 8.( low 0-10 high) Denies suicidal  homicidal ideations  .  No auditory hallucinations  No pain concerns . Appropriate ADL'S. Interacting with peers and staff. Patient complaints of racing thoughts  A: Encourage patient participation with unit programming . Instruction  Given on  Medication , verbalize understanding. R: Voice no other concerns. Staff continue to monitor

## 2016-06-06 NOTE — Progress Notes (Signed)
Recreation Therapy Notes  Date: 07.11.17 Time: 3:00 pm Location: Craft Room  Group Topic: Self-expression  Goal Area(s) Addresses:  Patient will be able to identify a color that represents each emotion. Patient will verbalize benefit of using art as a means of self-expression. Patient will verbalize one emotion experienced during session.  Behavioral Response: Attentive, Interactive  Intervention: The Colors Within Me  Activity: Patients were given a blank face worksheet and were instructed to pick a color for each emotion they were experiencing and to show on the face how much of that emotion they were feeling.  Education: LRT educated patients on different forms of self-expression.  Education Outcome: Acknowledges education/In group clarification offered   Clinical Observations/Feedback: Patient completed activity by picking a color for each emotion she was feeling and showing how much of that emotion she was feeling on the worksheet. Patient contributed to group discussion by stating what emotions she was feeling, how she thinks her emotions affect her treatment in the hospital, that her emotions are dynamic, how she sees her emotions changing once she is d/c, that it was helpful to see her emotions on paper and why, what steps she is taking to change her emotions, and why it is not healthy to bottle up her emotions.  Jacquelynn CreeGreene,Jarrell Armond M, LRT/CTRS 06/06/2016 4:21 PM

## 2016-06-06 NOTE — Progress Notes (Signed)
Patient ID: Rebeca AlertViola Banks, female   DOB: [birth year], 60 y.o.   MRN: [id number] Assisted PT in calling Social Security to request new Medicaid and medicare cards.  Pt has to contact post office then should be complete.  According to her she should now have regular medicare and medicaid.  Jake SharkSara Rayla Pember, LCSW

## 2016-06-06 NOTE — Progress Notes (Signed)
D: Pt denies SI/HI/AVH. Pt is pleasant and cooperative.Thoughts are organized, no bizarre behavior noted. Patient denies pain or discomfort.  Pt  appears less anxious and she is interacting with peers and staff appropriately.  A: Pt was offered support and encouragement. Pt was given scheduled medications. Pt was encouraged to attend groups. Q 15 minute checks were done for safety.  R:Pt attends groups and interacts well with peers and staff. Pt is taking medication. Pt has no complaints.Pt receptive to treatment and safety maintained on unit.

## 2016-06-06 NOTE — [facility] Group Notes (Addendum)
[facility] LCSW Group Therapy   06/06/2016 9:30 am  Type of Therapy: Group Therapy   Participation Level: Active   Participation Quality: Attentive, Sharing and Supportive   Affect: Appropriate  Cognitive: Alert and Oriented   Insight: Developing/Improving and Engaged   Engagement in Therapy: Developing/Improving and Engaged   Modes of Intervention: Clarification, Confrontation, Discussion, Education, Exploration,  Limit-setting, Orientation, Problem-solving, Rapport Building, Dance movement psychotherapisteality Testing, Socialization and Support  Summary of Progress/Problems: The topic for group therapy was feelings about diagnosis. Pt actively participated in group discussion on their past and current diagnosis and how they feel towards this. Pt also identified how society and family members judge them, based on their diagnosis as well as stereotypes and stigmas. Pt identified that the pt was initially fearful and anxious when the pt was diagnosed.  Pt shared that the pt was diagnosed as clinically depressed and that, as a result, the pt feels that the pt was stigmatized and judged by others and that others avoid talking about the pt's diagnosis with the pt.  Pt shared that the pt hoped to educate those in the pt's family that the pt is human like everyone else.  Pt seems to have made great improvement during group, as evidenced by increased sharing, sharing at length when prompted and pt presents a happy and increasingly energetic, as compared to previous sessions. Pt was polite and cooperative with the CSW and other group members and focused and attentive to the topics discussed and the sharing of others.  Dorothe PeaJonathan F. Kanyah Matsushima, LCSWA, LCAS  06/06/16

## 2016-06-07 LAB — PROLACTIN: PROLACTIN: 18.9 ng/mL (ref 4.8–23.3)

## 2016-06-07 NOTE — Progress Notes (Signed)
D: Patient stated slept good last night .Stated appetite is fair and energy level  Low . Stated concentration is good . Stated on Depression scale 5 , hopeless 4 and anxiety 6 .( low 0-10 high) Denies suicidal  homicidal ideations .  No auditory hallucinations  No pain concerns . Appropriate ADL'S. Interacting with peers and staff. Voice of wanting to live on her own,  Deal with feeling as they come up. Affect cheerful on approach  A: Encourage patient participation with unit programming . Instruction  Given on  Medication , verbalize understanding. R: Voice no other concerns. Staff continue to monitor

## 2016-06-07 NOTE — Progress Notes (Signed)
Calm and cooperative. States she's feeling better than yesterday. Med compliant. Ambien 5 mg po given 2202 PRN for sleep. Denies SI/HI. No behavior issues noted. Will continue to monitor for safety. Slept 7 hours.

## 2016-06-07 NOTE — [facility] Group Notes (Signed)
[facility] Group Notes:  (Nursing/MHT/Case Management/Adjunct)  Date:  06/07/2016  Time:  5:51 PM  Type of Therapy:  Psychoeducational Skills  Participation Level:  Active  Participation Quality:  Appropriate, Attentive and Sharing  Affect:  Appropriate  Cognitive:  Alert and Appropriate  Insight:  Appropriate and Good  Engagement in Group:  Engaged  Modes of Intervention:  Discussion, Education and Support  Summary of Progress/Problems:  Lynelle SmokeCara Travis Russie Gulledge 06/07/2016, 5:51 PM

## 2016-06-07 NOTE — Plan of Care (Signed)
Problem: Coping: Goal: Ability to cope will improve Outcome: Progressing Receiving listing of coping  Skills , encourage to work on them .      

## 2016-06-07 NOTE — Plan of Care (Signed)
Problem: [facility] Participation in Recreation Therapeutic Interventions Goal: STG-Patient will identify at least five coping skills for ** STG: Coping Skills - Within 4 treatment sessions, patient will verbalize at least 5 coping skills for anger in each of 2 treatment sessions to increase anger management skills post d/c.  Outcome: Progressing Treatment Session 1; Completed 1 out of 2: At approximately 3:00 pm, LRT met with patient in craft room. Patient verbalized 5 coping skills for anger. Patient verbalized what triggers her to get angry, how her body responds to anger, and how she is going to remember to use her healthy coping skills. LRT provided suggestions as well.  Leonette Monarch, LRT/CTRS 07.12.17 3:48 pm Goal: STG-Other Recreation Therapy Goal (Specify) STG: Stress Management - Within 4 treatment sessions, patient will verbalize understanding of the stress management techniques in each of 2 treatment sessions to increase stress management skills post d/c.  Outcome: Progressing Treatment Session 1; Completed 1 out of 2: At approximately 3:00 pm, LRT met with patient in craft room. LRT educated and provided patient with handouts on stress management techniques. Patient verbalized understanding. LRT encouraged patient to read over and practice the stress management techniques.  Leonette Monarch, LRT/CTRS 07.12.17 3:50 pm

## 2016-06-07 NOTE — Progress Notes (Addendum)
[facility] MD Progress Note  06/07/2016 6:28 PM Briellah Baik  MRN:  [id number]  Subjective:  Ms. Fetters continues to improve. Auditory hallucinations have resolved. Mood is improving, affect brighter. She has only passing suicidal thoughts when thinking about her mother who just passed away. There are no somatic complaints. Tolerates medications well. Good group participation. We were so far unable to identify a provider to follow up with due to [facility].  Principal Problem: Bipolar I disorder, most recent episode (or current) depressed, severe, specified as with psychotic behavior (HCC) Diagnosis:   Patient Active Problem List   Diagnosis Date Noted  . Cocaine use disorder, moderate, dependence (HCC) [F14.20] 06/05/2016  . Bipolar I disorder, most recent episode (or current) depressed, severe, specified as with psychotic behavior (HCC) [F31.5] 06/03/2016  . Suicidal ideation [R45.851] 06/03/2016  . GERD (gastroesophageal reflux disease) [K21.9] 06/03/2016  . Tobacco use disorder [F17.200] 06/03/2016  . Sedative, hypnotic or anxiolytic use disorder, severe, dependence (HCC) [F13.20] 06/03/2016   Total Time spent with patient: 20 minutes  Past Psychiatric History: bipolar disorder, substance abuse.  Past Medical History:  Past Medical History  Diagnosis Date  . Bipolar 1 disorder (HCC)   . Schizophrenia (HCC)   . Diabetes mellitus without complication (HCC)   . Hypertension   . Hyperlipidemia   . Bronchitis     Past Surgical History  Procedure Laterality Date  . Tubal ligation     Family History: History reviewed. No pertinent family history. Family Psychiatric  History: see H&P Social History:  History  Alcohol Use No     History  Drug Use No    Social History   Social History  . Marital Status: Divorced    Spouse Name: N/A  . Number of Children: N/A  . Years of Education: N/A   Social History Main Topics  . Smoking status: Current Every Day Smoker -- 1.00  packs/day    Types: Cigarettes  . Smokeless tobacco: Never Used  . Alcohol Use: No  . Drug Use: No  . Sexual Activity: Not Asked   Other Topics Concern  . None   Social History Narrative   Additional Social History:                         Sleep: Fair  Appetite:  Fair  Current Medications: Current Facility-Administered Medications  Medication Dose Route Frequency Provider Last Rate Last Dose  . acetaminophen (TYLENOL) tablet 650 mg  650 mg Oral Q6H PRN Quenesha Douglass B Floye Fesler, MD      . alum & mag hydroxide-simeth (MAALOX/MYLANTA) 200-200-20 MG/5ML suspension 30 mL  30 mL Oral Q4H PRN Nakenya Theall B Lanya Bucks, MD      . magnesium hydroxide (MILK OF MAGNESIA) suspension 30 mL  30 mL Oral Daily PRN Jobie Popp B Elouise Divelbiss, MD      . nicotine (NICODERM CQ - dosed in mg/24 hours) patch 21 mg  21 mg Transdermal Daily Akirra Lacerda B Terence Bart, MD   21 mg at 06/06/16 0853  . pantoprazole (PROTONIX) EC tablet 40 mg  40 mg Oral Daily Shari Prows, MD   40 mg at 06/07/16 0817  . QUEtiapine (SEROQUEL) tablet 400 mg  400 mg Oral QHS Shari Prows, MD   400 mg at 06/06/16 2202  . venlafaxine XR (EFFEXOR-XR) 24 hr capsule 150 mg  150 mg Oral Q breakfast Latania Bascomb B Mamoru Takeshita, MD   150 mg at 06/07/16 0817  . zolpidem (AMBIEN) tablet 5 mg  5 mg Oral QHS PRN Shari Prows, MD   5 mg at 06/06/16 2202    Lab Results:  Results for orders placed or performed during the hospital encounter of 06/05/16 (from the past 48 hour(s))  Hemoglobin A1c     Status: None   Collection Time: 06/06/16  6:46 AM  Result Value Ref Range   Hgb A1c MFr Bld 6.0 4.0 - 6.0 %  Lipid panel, fasting     Status: Abnormal   Collection Time: 06/06/16  6:46 AM  Result Value Ref Range   Cholesterol 224 (H) 0 - 200 mg/dL   Triglycerides 161 (H) <150 mg/dL   HDL 56 >09 mg/dL   Total CHOL/HDL Ratio 4.0 RATIO   VLDL 34 0 - 40 mg/dL   LDL Cholesterol 604 (H) 0 - 99 mg/dL    Comment:        Total  Cholesterol/HDL:CHD Risk Coronary Heart Disease Risk Table                     Men   Women  1/2 Average Risk   3.4   3.3  Average Risk       5.0   4.4  2 X Average Risk   9.6   7.1  3 X Average Risk  23.4   11.0        Use the calculated Patient Ratio above and the CHD Risk Table to determine the patient's CHD Risk.        ATP III CLASSIFICATION (LDL):  <100     mg/dL   Optimal  540-981  mg/dL   Near or Above                    Optimal  130-159  mg/dL   Borderline  191-478  mg/dL   High  >295     mg/dL   Very High   Prolactin     Status: None   Collection Time: 06/06/16  6:46 AM  Result Value Ref Range   Prolactin 18.9 4.8 - 23.3 ng/mL    Comment: (NOTE) Performed At: [facility] [address] [place], [place] [id number] Mila Homer MD QI:[id number]   TSH     Status: Abnormal   Collection Time: 06/06/16  6:46 AM  Result Value Ref Range   TSH 0.209 (L) 0.350 - 4.500 uIU/mL  T4, free     Status: None   Collection Time: 06/06/16  6:46 AM  Result Value Ref Range   Free T4 0.71 0.61 - 1.12 ng/dL    Comment: (NOTE) Biotin ingestion may interfere with free T4 tests. If the results are inconsistent with the TSH level, previous test results, or the clinical presentation, then consider biotin interference. If needed, order repeat testing after stopping biotin.     Blood Alcohol level:  Lab Results  Component Value Date   ETH <5 06/02/2016    Metabolic Disorder Labs: Lab Results  Component Value Date   HGBA1C 6.0 06/06/2016   Lab Results  Component Value Date   PROLACTIN 18.9 06/06/2016   Lab Results  Component Value Date   CHOL 224* 06/06/2016   TRIG 172* 06/06/2016   HDL 56 06/06/2016   CHOLHDL 4.0 06/06/2016   VLDL 34 06/06/2016   LDLCALC 134* 06/06/2016    Physical Findings: AIMS: Facial and Oral Movements Muscles of Facial Expression: None, normal Lips and Perioral Area: None, normal Jaw: None, normal Tongue: None, normal,Extremity  Movements  Upper (arms, wrists, hands, fingers): None, normal Lower (legs, knees, ankles, toes): None, normal,  , Overall Severity Severity of abnormal movements (highest score from questions above): None, normal Patient's awareness of abnormal movements (rate only patient's report): No Awareness, Dental Status Current problems with teeth and/or dentures?: No Does patient usually wear dentures?: No  CIWA:    COWS:     Musculoskeletal: Strength & Muscle Tone: within normal limits Gait & Station: normal Patient leans: N/A  Psychiatric Specialty Exam: Physical Exam  Nursing note and vitals reviewed.   Review of Systems  Psychiatric/Behavioral: Positive for depression, hallucinations and substance abuse.  All other systems reviewed and are negative.   Blood pressure 118/57, pulse 81, temperature 97.8 F (36.6 C), temperature source Oral, resp. rate 18, height 5\' 4"  (1.626 m), weight 77.565 kg (171 lb), SpO2 100 %.Body mass index is 29.34 kg/(m^2).  General Appearance: Casual  Eye Contact:  Good  Speech:  Clear and Coherent  Volume:  Normal  Mood:  Anxious  Affect:  Appropriate  Thought Process:  Goal Directed  Orientation:  Full (Time, Place, and Person)  Thought Content:  WDL  Suicidal Thoughts:  Yes.  with intent/plan  Homicidal Thoughts:  No  Memory:  Immediate;   Fair Recent;   Fair Remote;   Fair  Judgement:  Impaired  Insight:  Shallow  Psychomotor Activity:  Normal  Concentration:  Concentration: Fair and Attention Span: Fair  Recall:  [employer]Fair  Fund of Knowledge:  Fair  Language:  Fair  Akathisia:  No  Handed:  Right  AIMS (if indicated):     Assets:  Communication Skills Desire for Improvement Housing Physical Health Resilience Social Support  ADL's:  Intact  Cognition:  WNL  Sleep:  Number of Hours: 7     Treatment Plan Summary: Daily contact with patient to assess and evaluate symptoms and progress in treatment and Medication management   Ms. Sladek is  a 60 year old female with a history of bipolar disorder admitted for worsening of depression, psychosis, and suicidal ideation in the context of major loss and severe social stressors.  1. Agitation. This has resolved.   2. Suicidal ideation. The patient is able to contract for safety in the hospital.  3. Mood and psychosis. She was restarted on a combination of Seroquel and Lexapro that were helpful in the past. We continue Seroquel at 400 mg for psychosis and mood stabilization but switched her to Effexor to address depressive symptoms.   4. Insomnia. We discontinued trazodone and started Ambien.  5. Smoking. Nicotine patch is available.  6. GERD. She is on Protonix.  7. Benzodiazepine use. The patient was reportedly prescribed 2 mg of Xanax 3 times a day in [place]. She completed Librium taper.   8. Metabolic syndrome monitoring. Lipid panel is slightly elevated, TSH is low but Fee T4 is normal. Hemoglobin A1c is normal. Prolactin 18.9.   9. Substance abuse treatment. The patient is positive for cocaine but denies ever using. She is not interested in substance abuse treatment.   10. Disposition. The patient will be discharged with her family. She will follow up with [facility].  Kristine LineaJolanta Luwanna Brossman, MD 06/07/2016, 6:28 PM

## 2016-06-07 NOTE — Progress Notes (Signed)
Recreation Therapy Notes  Date: 07.12.17 Time: 1:00 pm Location: Craft Room  Group Topic: Self-esteem  Goal Area(s) Addresses:  Patient will write at least one positive trait about self. Patient will verbalize benefit of having healthy self-esteem.  Behavioral Response: Did not attend  Intervention: I Am  Activity: Patients were given a worksheet with the letter I on it and instructed to write as many positive traits about themselves inside the letter.  Education: LRT educated patients on ways they can increase their self-esteem.  Education Outcome: Patient did not attend group.  Clinical Observations/Feedback: Patient did not attend group.  Jacquelynn CreeGreene,Sigmund Morera M, LRT/CTRS 06/07/2016 3:33 PM

## 2016-06-07 NOTE — [facility] Group Notes (Signed)
[facility] LCSW Group Therapy   06/07/2016  9:30am   Type of Therapy: Group Therapy   Participation Level: Active   Participation Quality: Attentive, Sharing and Supportive   Affect: Depressed and Flat   Cognitive: Alert and Oriented   Insight: Developing/Improving and Engaged   Engagement in Therapy: Developing/Improving and Engaged   Modes of Intervention: Clarification, Confrontation, Discussion, Education, Exploration, Limit-setting, Orientation, Problem-solving, Rapport Building, Dance movement psychotherapisteality Testing, Socialization and Support   Summary of Progress/Problems: The topic for group today was emotional regulation. This group focused on both positive and negative emotion identification and allowed  group members to process ways to identify feelings, regulate negative emotions, and find healthy ways to manage internal/external emotions. Group members were asked to reflect on a time when their reaction to an emotion led to a negative outcome and explored how alternative responses using emotion regulation would have benefited them. Group members were also asked to discuss a time when emotion regulation was utilized when a negative emotion was experienced. Pt shared at length about how the pt had difficulty regulating the pt's emotions due to the actions of the pt's family members.  Pt shared the pt plans to speak to the pt's family members in the future to open up the lines of communications and ventilate the pt's emotions in an healthy way.    [name], MSW, LCSWA, LCAS

## 2016-06-07 NOTE — Progress Notes (Signed)
[facility] MD Progress Note  06/07/2016 9:50 AM Linda Banks  MRN:  [id number]  Subjective:  Linda Banks reports some improvement but feels depressed and has passing thoughts of suicide. Her affect is flat. She is minimally participating in the interview. She met with Sherrian Divers, [facility] representative, to discuss follow-up appointment. She denies heightened anxiety or psychosis. There are no somatic complaints. The patient attempts to participate in programming.  Principal Problem: Bipolar I disorder, most recent episode (or current) depressed, severe, specified as with psychotic behavior ([place]) Diagnosis:   Patient Active Problem List   Diagnosis Date Noted  . Cocaine use disorder, moderate, dependence ([place]) [F14.20] 06/05/2016  . Bipolar I disorder, most recent episode (or current) depressed, severe, specified as with psychotic behavior ([place]) [F31.5] 06/03/2016  . Suicidal ideation [R45.851] 06/03/2016  . GERD (gastroesophageal reflux disease) [K21.9] 06/03/2016  . Tobacco use disorder [F17.200] 06/03/2016  . Sedative, hypnotic or anxiolytic use disorder, severe, dependence ([place]) [F13.20] 06/03/2016   Total Time spent with patient: 20 minutes  Past Psychiatric History: Bipolar disorder.  Past Medical History:  Past Medical History  Diagnosis Date  . Bipolar 1 disorder ([place])   . Schizophrenia ([place])   . Diabetes mellitus without complication ([place])   . Hypertension   . Hyperlipidemia   . Bronchitis     Past Surgical History  Procedure Laterality Date  . Tubal ligation     Family History: History reviewed. No pertinent family history. Family Psychiatric  History: See H&P. Social History:  History  Alcohol Use No     History  Drug Use No    Social History   Social History  . Marital Status: Divorced    Spouse Name: N/A  . Number of Children: N/A  . Years of Education: N/A   Social History Main Topics  . Smoking status: Current Every Day Smoker -- 1.00 packs/day    Types:  Cigarettes  . Smokeless tobacco: Never Used  . Alcohol Use: No  . Drug Use: No  . Sexual Activity: Not Asked   Other Topics Concern  . None   Social History Narrative   Additional Social History:                         Sleep: Fair  Appetite:  Fair  Current Medications: Current Facility-Administered Medications  Medication Dose Route Frequency Provider Last Rate Last Dose  . acetaminophen (TYLENOL) tablet 650 mg  650 mg Oral Q6H PRN Shakelia Scrivner B Jena Tegeler, MD      . alum & mag hydroxide-simeth (MAALOX/MYLANTA) 200-200-20 MG/5ML suspension 30 mL  30 mL Oral Q4H PRN Franz Svec B  Antolin, MD      . magnesium hydroxide (MILK OF MAGNESIA) suspension 30 mL  30 mL Oral Daily PRN Najma Bozarth B Mykale Gandolfo, MD      . nicotine (NICODERM CQ - dosed in mg/24 hours) patch 21 mg  21 mg Transdermal Daily Minh Jasper B Leigha Olberding, MD   21 mg at 06/06/16 0853  . pantoprazole (PROTONIX) EC tablet 40 mg  40 mg Oral Daily Clovis Fredrickson, MD   40 mg at 06/07/16 0817  . QUEtiapine (SEROQUEL) tablet 400 mg  400 mg Oral QHS Clovis Fredrickson, MD   400 mg at 06/06/16 2202  . venlafaxine XR (EFFEXOR-XR) 24 hr capsule 150 mg  150 mg Oral Q breakfast Lupita Rosales B Ryker [place], MD   150 mg at 06/07/16 0817  . zolpidem (AMBIEN) tablet 5 mg  5 mg Oral QHS  PRN Clovis Fredrickson, MD   5 mg at 06/06/16 2202    Lab Results:  Results for orders placed or performed during the hospital encounter of 06/05/16 (from the past 48 hour(s))  Hemoglobin A1c     Status: None   Collection Time: 06/06/16  6:46 AM  Result Value Ref Range   Hgb A1c MFr Bld 6.0 4.0 - 6.0 %  Lipid panel, fasting     Status: Abnormal   Collection Time: 06/06/16  6:46 AM  Result Value Ref Range   Cholesterol 224 (H) 0 - 200 mg/dL   Triglycerides 172 (H) <150 mg/dL   HDL 56 >40 mg/dL   Total CHOL/HDL Ratio 4.0 RATIO   VLDL 34 0 - 40 mg/dL   LDL Cholesterol 134 (H) 0 - 99 mg/dL    Comment:        Total Cholesterol/HDL:CHD Risk Coronary  Heart Disease Risk Table                     Men   Women  1/2 Average Risk   3.4   3.3  Average Risk       5.0   4.4  2 X Average Risk   9.6   7.1  3 X Average Risk  23.4   11.0        Use the calculated Patient Ratio above and the CHD Risk Table to determine the patient's CHD Risk.        ATP III CLASSIFICATION (LDL):  <100     mg/dL   Optimal  100-129  mg/dL   Near or Above                    Optimal  130-159  mg/dL   Borderline  160-189  mg/dL   High  >190     mg/dL   Very High   Prolactin     Status: None   Collection Time: 06/06/16  6:46 AM  Result Value Ref Range   Prolactin 18.9 4.8 - 23.3 ng/mL    Comment: (NOTE) Performed At: [facility] [place], [place] [id number] Lindon Romp MD [id number]   TSH     Status: Abnormal   Collection Time: 06/06/16  6:46 AM  Result Value Ref Range   TSH 0.209 (L) 0.350 - 4.500 uIU/mL  T4, free     Status: None   Collection Time: 06/06/16  6:46 AM  Result Value Ref Range   Free T4 0.71 0.61 - 1.12 ng/dL    Comment: (NOTE) Biotin ingestion may interfere with free T4 tests. If the results are inconsistent with the TSH level, previous test results, or the clinical presentation, then consider biotin interference. If needed, order repeat testing after stopping biotin.     Blood Alcohol level:  Lab Results  Component Value Date   ETH <5 92/09/9416    Metabolic Disorder Labs: Lab Results  Component Value Date   HGBA1C 6.0 06/06/2016   Lab Results  Component Value Date   PROLACTIN 18.9 06/06/2016   Lab Results  Component Value Date   CHOL 224* 06/06/2016   TRIG 172* 06/06/2016   HDL 56 06/06/2016   CHOLHDL 4.0 06/06/2016   VLDL 34 06/06/2016   LDLCALC 134* 06/06/2016    Physical Findings: AIMS: Facial and Oral Movements Muscles of Facial Expression: None, normal Lips and Perioral Area: None, normal Jaw: None, normal Tongue: None, normal,Extremity Movements Upper (arms, wrists,  hands,  fingers): None, normal Lower (legs, knees, ankles, toes): None, normal,  , Overall Severity Severity of abnormal movements (highest score from questions above): None, normal Patient's awareness of abnormal movements (rate only patient's report): No Awareness, Dental Status Current problems with teeth and/or dentures?: No Does patient usually wear dentures?: No  CIWA:    COWS:     Musculoskeletal: Strength & Muscle Tone: within normal limits Gait & Station: normal Patient leans: N/A  Psychiatric Specialty Exam: Physical Exam  Nursing note and vitals reviewed.   Review of Systems  Psychiatric/Behavioral: Positive for depression and suicidal ideas.  All other systems reviewed and are negative.   Blood pressure 118/57, pulse 81, temperature 97.8 F (36.6 C), temperature source Oral, resp. rate 18, height 5' 4"  (1.626 m), weight 77.565 kg (171 lb), SpO2 100 %.Body mass index is 29.34 kg/(m^2).  General Appearance: Casual  Eye Contact:  Good  Speech:  Clear and Coherent  Volume:  Decreased  Mood:  Depressed, Hopeless and Worthless  Affect:  Blunt  Thought Process:  Goal Directed  Orientation:  Full (Time, Place, and Person)  Thought Content:  WDL  Suicidal Thoughts:  Yes.  with intent/plan  Homicidal Thoughts:  No  Memory:  Immediate;   Fair Recent;   Fair Remote;   Fair  Judgement:  Impaired  Insight:  Shallow  Psychomotor Activity:  Psychomotor Retardation  Concentration:  Concentration: Fair and Attention Span: Fair  Recall:  [employer] of Knowledge:  Fair  Language:  Fair  Akathisia:  No  Handed:  Right  AIMS (if indicated):     Assets:  Communication Skills Desire for Improvement Housing Physical Health Resilience Social Support  ADL's:  Intact  Cognition:  WNL  Sleep:  Number of Hours: 7     Treatment Plan Summary: Daily contact with patient to assess and evaluate symptoms and progress in treatment and Medication management   Linda Banks is a  60 year old female with a history of bipolar disorder admitted for worsening of depression, psychosis, and suicidal ideation in the context of major loss and severe social stressors.  1. Agitation. This has resolved.   2. Suicidal ideation. The patient is able to contract for safety in the hospital.  3. Mood and psychosis. She was restarted on a combination of Seroquel and Lexapro that were helpful in the past.  We continue Seroquel at 400 mg for psychosis and mood stabilization but switched her to Effexor to address depressive symptoms.   4. Insomnia. We discontinued trazodone and started Ambien.  5. Smoking. Nicotine patch is available.  6. GERD. She is on Protonix.  7. Benzodiazepine use. The patient was reportedly prescribed 2 mg of Xanax 3 times a day in [place]. She completed Librium taper.    8. Metabolic syndrome monitoring. Lipid panel is slightly elevated, TSH is low but Fee T4 is normal. Hemoglobin A1c is normal. Prolactin 18.9.    9. Substance abuse treatment. The patient is positive for cocaine but denies ever using. She is not interested in substance abuse treatment.   10. Disposition. The patient will be discharged with her family. She will follow up with [facility].  Orson Slick, MD 06/07/2016, 9:50 AM

## 2016-06-08 LAB — GLUCOSE, CAPILLARY
GLUCOSE-CAPILLARY: 144 mg/dL — AB (ref 65–99)
Glucose-Capillary: 140 mg/dL — ABNORMAL HIGH (ref 65–99)

## 2016-06-08 MED ORDER — QUETIAPINE FUMARATE ER 300 MG PO TB24: 300.0000 mg | ORAL_TABLET | Freq: Every day | ORAL | Status: DC

## 2016-06-08 MED ORDER — ESOMEPRAZOLE MAGNESIUM 40 MG PO CPDR: 40.0000 mg | DELAYED_RELEASE_CAPSULE | Freq: Every day | ORAL | Status: AC

## 2016-06-08 MED ORDER — QUETIAPINE FUMARATE ER 300 MG PO TB24: 300.0000 mg | ORAL_TABLET | Freq: Every day | ORAL | Status: AC

## 2016-06-08 MED ORDER — ZOLPIDEM TARTRATE 5 MG PO TABS: 5.0000 mg | ORAL_TABLET | Freq: Every evening | ORAL | Status: AC | PRN

## 2016-06-08 MED ORDER — VENLAFAXINE HCL ER 150 MG PO CP24: 150.0000 mg | ORAL_CAPSULE | Freq: Every day | ORAL | Status: AC

## 2016-06-08 MED ORDER — ESOMEPRAZOLE MAGNESIUM 40 MG PO CPDR: 40.0000 mg | DELAYED_RELEASE_CAPSULE | Freq: Every day | ORAL | Status: DC

## 2016-06-08 MED ORDER — ZOLPIDEM TARTRATE 5 MG PO TABS: 5.0000 mg | ORAL_TABLET | Freq: Every evening | ORAL | Status: DC | PRN

## 2016-06-08 MED ORDER — VENLAFAXINE HCL ER 150 MG PO CP24: 150.0000 mg | ORAL_CAPSULE | Freq: Every day | ORAL | Status: DC

## 2016-06-08 NOTE — [facility] Group Notes (Signed)
[facility] Group Notes:  (Nursing/MHT/Case Management/Adjunct)  Date:  06/08/2016  Time:  3:55 PM  Type of Therapy:  Psychoeducational Skills  Participation Level:  Active  Participation Quality:  Appropriate, Attentive and Sharing  Affect:  Appropriate  Cognitive:  Alert and Appropriate  Insight:  Appropriate  Engagement in Group:  Engaged  Modes of Intervention:  Discussion, Education and Support  Summary of Progress/Problems:  Linda SmokeCara Travis [name] 06/08/2016, 3:55 PM

## 2016-06-08 NOTE — Progress Notes (Signed)
Recreation Therapy Notes  Date: 07.13.17 Time: 1:00 pm Location: Craft Room  Group Topic: Leisure Education  Goal Area(s) Addresses:  Patient will identify activities for each letter of the alphabet. Patient will verbalize ability to integrate positive leisure into life post d/c. Patient will verbalize ability to use leisure as a Associate Professorcoping skill.  Behavioral Response: Did not attend  Intervention: Leisure Alphabet  Activity: Patients were given a Leisure Information systems managerAlphabet worksheet and instructed to identify a leisure activity for each letter of the alphabet.   Education: LRT educated patients on what they need to participate in leisure.  Education Outcome: Patient did not attend group.  Clinical Observations/Feedback: Patient did not attend group.  Jacquelynn CreeGreene,Aniston Christman M, LRT/CTRS 06/08/2016 3:55 PM

## 2016-06-08 NOTE — Plan of Care (Signed)
Problem: Education: Goal: Utilization of techniques to improve thought processes will improve Outcome: Progressing Educated on medication  regiment

## 2016-06-08 NOTE — [facility] Suicide Risk Assessment (Addendum)
[facility]   Principal Problem: Bipolar I disorder, most recent episode (or current) depressed, severe, specified as with psychotic behavior [facility](HCC) Discharge Diagnoses:  Patient Active Problem List   Diagnosis Date Noted  . Cocaine use disorder, moderate, dependence (HCC) [F14.20] 06/05/2016  . Bipolar I disorder, most recent episode (or current) depressed, severe, specified as with psychotic behavior ([facility]CC) [F31.5] 06/03/2016  . Suicidal ideation [R45.851] 06/03/2016  . GERD (gastroesophageal reflux disease) [K21.9] 06/03/2016  . Tobacco use disorder [F17.200] 06/03/2016  . Sedative, hypnotic or anxiolytic use disorder, severe, dependence ([facility]CC) [F13.20] 06/03/2016    Total Time spent with patient: 30 minutes  Musculoskeletal: Strength & Muscle Tone: within normal limits Gait & Station: normal Patient leans: N/A  Psychiatric Specialty Exam: Review of Systems  All other systems reviewed and are negative.   Blood pressure 121/71, pulse 85, temperature 98 F (36.7 C), temperature source Oral, resp. rate 18, height 5\' 4"  (1.626 m), weight 77.565 kg (171 lb), SpO2 100 %.Body mass index is 29.34 kg/(m^2).  General Appearance: Casual  Eye Contact::  Good  Speech:  Clear and Coherent409  Volume:  Normal  Mood:  Anxious  Affect:  Appropriate  Thought Process:  Goal Directed  Orientation:  Full (Time, Place, and Person)  Thought Content:  WDL  Suicidal Thoughts:  No  Homicidal Thoughts:  No  Memory:  Immediate;   Fair Recent;   Fair Remote;   Fair  Judgement:  Impaired  Insight:  Shallow  Psychomotor Activity:  Normal  Concentration:  Fair  Recall:  [employer]  Fund of Knowledge:Fair  Language: Fair  Akathisia:  No  Handed:  Right  AIMS (if indicated):     Assets:  Communication Skills Desire for Improvement Housing Physical Health Resilience Social Support  Sleep:  Number of Hours: 6.45  Cognition: WNL  ADL's:  Intact   Mental Status Per Nursing  Assessment::   On Admission:     Demographic Factors:  Low socioeconomic status and Unemployed  Loss Factors: Loss of significant relationship and Financial problems/change in socioeconomic status  Historical Factors: Prior suicide attempts and Impulsivity  Risk Reduction Factors:   Sense of responsibility to family, Living with another person, especially a relative and Positive social support  Continued Clinical Symptoms:  Bipolar Disorder:   Depressive phase Depression:   Comorbid alcohol abuse/dependence Impulsivity Alcohol/Substance Abuse/Dependencies  Cognitive Features That Contribute To Risk:  None    Suicide Risk:  Minimal: No identifiable suicidal ideation.  Patients presenting with no risk factors but with morbid ruminations; may be classified as minimal risk based on the severity of the depressive symptoms    Plan Of Care/Follow-up recommendations:  Activity:  as tolerated. Diet:  low sodium heart healthy. Other:  keep follow up appointments.  Kristine LineaJolanta Josefine Fuhr, MD 06/08/2016, 4:05 PM

## 2016-06-08 NOTE — Plan of Care (Signed)
Problem: [facility] Participation in Recreation Therapeutic Interventions Goal: STG-Patient will identify at least five coping skills for ** STG: Coping Skills - Within 4 treatment sessions, patient will verbalize at least 5 coping skills for anger in each of 2 treatment sessions to increase anger management skills post d/c.  Outcome: Completed/Met Date Met:  06/08/16 Treatment Session 2; Completed 2 out of 2: At approximately 11:30 am, LRT met with patient in patient room. Patient verbalized 5 coping skills for anger. LRT encouraged patient to use her healthy coping skills when she felt herself getting angry to help calm herself down.  Leonette Monarch, LRT/CTRS 07.13.17 12:54 pm Goal: STG-Other Recreation Therapy Goal (Specify) STG: Stress Management - Within 4 treatment sessions, patient will verbalize understanding of the stress management techniques in each of 2 treatment sessions to increase stress management skills post d/c.  Outcome: Completed/Met Date Met:  06/08/16 Treatment Session 2; Completed 2 out of 2: At approximately 11:30 am, LRT met with patient in patient room. Patient reported she read over some of the stress management techniques. Patient verbalized understanding. LRT encouraged patient to continue reading the techniques and to practice them.  Leonette Monarch, LRT/CTRS 07.13.17 12:56 pm

## 2016-06-08 NOTE — Progress Notes (Signed)
D: Patient stated slept good last night .Stated appetite is good and energy level  Is normal. Stated concentration is good . Stated on Depression scale 4 , hopeless 4 and anxiety6 .( low 0-10 high) Denies suicidal  homicidal ideations  .  No auditory hallucinations  No pain concerns . Appropriate ADL'S. Interacting with peers and staff.  Voice of goal to over come her fears  O;ut of room ,Affect cheerful on approach A: Encourage patient participation with unit programming . Instruction  Given on  Medication , verbalize understanding. R: Voice no other concerns. Staff continue to monitor

## 2016-06-08 NOTE — Discharge Summary (Signed)
Physician Discharge Summary Note  Patient:  Linda Banks is an 60 y.o., female MRN:  [id number] DOB:  [birth year] Patient phone:  [phone] (home)  Patient address:   [address] [place] [address],  Total Time spent with patient: 30 minutes  Date of Admission:  06/05/2016 Date of Discharge: 06/09/2016  Reason for Admission:  Suicidal ideation.  Identifying data. Linda Banks is a 60 y.o. female with a history of bipolar depression.  Chief complaint. "I am very sad."  History of present illness. Information was obtained from the patient and the chart. The patient has a long history of bipolar illness with multiple psychiatric hospitalizations. She was doing well on a combination of Lexapro and Seroquel prescribed by her providers in [place]. In addition she was prescribed Xanax 2 mg 3 times a day and narcotic painkillers. In 27-Apr-2023 her mother passed away. The patient moved in with her mother to take care of her but following her and another family member clearing the house and the patient was forced to come to [place] to stay with a relative. She came here 3 days ago and has been off medications as she does not establish care in [place] as of yet. She became increasingly depressed with poor sleep, decreased appetite, anhedonia, feeling of guilt and hopelessness worthlessness, poor energy and concentration, social isolation, crying spells, and suicidal ideation. The patient denies psychotic symptoms on initial evaluation by today in the emergency room she became very agitated loud and hallucinating. The patient complains of heightened anxiety possibly due to Xanax withdrawal. She denies alcohol or illicit substance use but is positive for benzodiazepines and cocaine on admission.  Past psychiatric history. Reports 4 or 5 hospitalizations mostly in [place]. She has been tried on multiple medications but like Seroquel the most. He attempted suicide by cutting 10 years ago.  Family  psychiatric history. According to the patient everybody has mental illness.  Social history. She plans to stay in [place]. She has no health insurance.   Principal Problem: Bipolar I disorder, most recent episode (or current) depressed, severe, specified as with psychotic behavior [facility]) Discharge Diagnoses: Patient Active Problem List   Diagnosis Date Noted  . Cocaine use disorder, moderate, dependence (HCC) [F14.20] 06/05/2016  . Bipolar I disorder, most recent episode (or current) depressed, severe, specified as with psychotic behavior (HCC) [F31.5] 06/03/2016  . Suicidal ideation [R45.851] 06/03/2016  . GERD (gastroesophageal reflux disease) [K21.9] 06/03/2016  . Tobacco use disorder [F17.200] 06/03/2016  . Sedative, hypnotic or anxiolytic use disorder, severe, dependence (HCC) [F13.20] 06/03/2016   Past Medical History:  Past Medical History  Diagnosis Date  . Bipolar 1 disorder (HCC)   . Schizophrenia (HCC)   . Diabetes mellitus without complication (HCC)   . Hypertension   . Hyperlipidemia   . Bronchitis     Past Surgical History  Procedure Laterality Date  . Tubal ligation     Family History: History reviewed. No pertinent family history.  Social History:  History  Alcohol Use No     History  Drug Use No    Social History   Social History  . Marital Status: Divorced    Spouse Name: N/A  . Number of Children: N/A  . Years of Education: N/A   Social History Main Topics  . Smoking status: Current Every Day Smoker -- 1.00 packs/day    Types: Cigarettes  . Smokeless tobacco: Never Used  . Alcohol Use: No  . Drug Use: No  .  Sexual Activity: Not Asked   Other Topics Concern  . None   Social History Narrative    Hospital Course:    Linda Banks is a 60 year old female with a history of bipolar disorder admitted for worsening of depression, psychosis, and suicidal ideation in the context of major loss, severe social stressors, and substance  abuse.  1. Agitation. This has resolved.   2. Suicidal ideation. This has resolved. The patient is able to contract for safety. She is forward thinking and optimistic about the future.   3. Mood and psychosis. She was started on a combination of Seroquel and Effexor for depression and mood stabilization.    4. Insomnia. She responded well to Ambien.  5. Smoking. Nicotine patch was available.  6. GERD. She is on Protonix.  7. Benzodiazepine use. The patient was reportedly prescribed 2 mg of Xanax 3 times a day in [place]. She completed Librium taper.Vital signs were stable.  8. Metabolic syndrome monitoring. Lipid panel is slightly elevated, TSH is low but Fee T4 is normal. Hemoglobin A1c is normal. Prolactin 18.9.   9. Substance abuse treatment. The patient is positive for cocaine but denies ever using drugs. She is not interested in substance abuse treatment.   10. Disposition. The patient was discharged with her family. She will follow up with grief counselor at the Hospice and with a local provider for medication management.   Physical Findings: AIMS: Facial and Oral Movements Muscles of Facial Expression: None, normal Lips and Perioral Area: None, normal Jaw: None, normal Tongue: None, normal,Extremity Movements Upper (arms, wrists, hands, fingers): None, normal Lower (legs, knees, ankles, toes): None, normal,  , Overall Severity Severity of abnormal movements (highest score from questions above): None, normal Patient's awareness of abnormal movements (rate only patient's report): No Awareness, Dental Status Current problems with teeth and/or dentures?: No Does patient usually wear dentures?: No  CIWA:    COWS:     Musculoskeletal: Strength & Muscle Tone: within normal limits Gait & Station: normal Patient leans: N/A  Psychiatric Specialty Exam: Physical Exam  Nursing note and vitals reviewed.   Review of Systems  Psychiatric/Behavioral: The patient is  nervous/anxious.   All other systems reviewed and are negative.   Blood pressure 121/71, pulse 85, temperature 98 F (36.7 C), temperature source Oral, resp. rate 18, height 5\' 4"  (1.626 m), weight 77.565 kg (171 lb), SpO2 100 %.Body mass index is 29.34 kg/(m^2).  See SRA.                                                  Sleep:  Number of Hours: 6.45     Have you used any form of tobacco in the last 30 days? (Cigarettes, Smokeless Tobacco, Cigars, and/or Pipes): Yes  Has this patient used any form of tobacco in the last 30 days? (Cigarettes, Smokeless Tobacco, Cigars, and/or Pipes) Yes, Yes, A prescription for an FDA-approved tobacco cessation medication was offered at discharge and the patient refused  Blood Alcohol level:  Lab Results  Component Value Date   [facility] <5 06/02/2016    Metabolic Disorder Labs:  Lab Results  Component Value Date   HGBA1C 6.0 06/06/2016   Lab Results  Component Value Date   PROLACTIN 18.9 06/06/2016   Lab Results  Component Value Date   CHOL 224* 06/06/2016   TRIG 172* 06/06/2016  HDL 56 06/06/2016   CHOLHDL 4.0 06/06/2016   VLDL 34 06/06/2016   LDLCALC 134* 06/06/2016    See Psychiatric Specialty Exam and Suicide Risk Assessment completed by Attending Physician prior to discharge.  Discharge destination:  Home  Is patient on multiple antipsychotic therapies at discharge:  No   Has Patient had three or more failed trials of antipsychotic monotherapy by history:  No  Recommended Plan for Multiple Antipsychotic Therapies: NA  Discharge Instructions    Diet - low sodium heart healthy    Complete by:  As directed      Increase activity slowly    Complete by:  As directed             Medication List    STOP taking these medications        escitalopram 20 MG tablet  Commonly known as:  LEXAPRO     hydrOXYzine 25 MG capsule  Commonly known as:  VISTARIL      TAKE these medications      Indication    esomeprazole 40 MG capsule  Commonly known as:  NEXIUM  Take 1 capsule (40 mg total) by mouth daily at 12 noon.   Indication:  Gastroesophageal Reflux Disease with Current Symptoms     QUEtiapine 300 MG 24 hr tablet  Commonly known as:  SEROQUEL XR  Take 1 tablet (300 mg total) by mouth at bedtime.   Indication:  Manic-Depression     venlafaxine XR 150 MG 24 hr capsule  Commonly known as:  EFFEXOR-XR  Take 1 capsule (150 mg total) by mouth daily with breakfast.   Indication:  Major Depressive Disorder     zolpidem 5 MG tablet  Commonly known as:  AMBIEN  Take 1 tablet (5 mg total) by mouth at bedtime as needed for sleep.   Indication:  Trouble Sleeping         Follow-up recommendations:  Activity:  as tolerated. Diet:  low sodium heart healthy. Other:  keep follow up appointments.  Comments:    Signed: Kristine LineaJolanta Roena Sassaman, MD 06/08/2016, 4:11 PM

## 2016-06-08 NOTE — [facility] Group Notes (Addendum)
Goals Group Date/Time: 06/08/16 9am Type of Therapy and Topic: Group Therapy: Goals Group: SMART Goals   Participation Level: Moderate  Description of Group:    The purpose of a daily goals group is to assist and guide patients in setting recovery/wellness-related goals. The objective is to set goals as they relate to the crisis in which they were admitted. Patients will be using SMART goal modalities to set measurable goals. Characteristics of realistic goals will be discussed and patients will be assisted in setting and processing how one will reach their goal. Facilitator will also assist patients in applying interventions and coping skills learned in psycho-education groups to the SMART goal and process how one will achieve defined goal.   Therapeutic Goals:   -Patients will develop and document one goal related to or their crisis in which brought them into treatment.  -Patients will be guided by LCSW using SMART goal setting modality in how to set a measurable, attainable, realistic and time sensitive goal.  -Patients will process barriers in reaching goal.  -Patients will process interventions in how to overcome and successful in reaching goal.   Patient's Goal: Pt was polite and cooperative with the CSW and other group members and focused and attentive to the topics discussed and the sharing of others.  Pt presented as pleasant and calm, with an appropriate affect and sharing was appropriate.  Pt shared the pt's goal is to remain alert to the fact that some family members will take advantage of the pt and tht the pt must love the pt's family members unconditionally, but the pt must not allow the pt to be treated badly by others.  Pt seems to have made great improvement during group, as evidenced by increased sharing, sharing at length when prompted and pt presents a happy and increasingly energetic, as compared to previous sessions.      Therapeutic Modalities:  Motivational Interviewing   Research officer, political partyCognitive Behavioral Therapy  Crisis Intervention Model  SMART goals setting   Dorothe PeaJonathan F. Betsaida [place], LCSWA, LCAS

## 2016-06-08 NOTE — [facility] Group Notes (Signed)
[facility] LCSW Group Therapy   06/08/2016 9:30 AM   Type of Therapy: Group Therapy   Participation Level: Active   Participation Quality: Attentive, Sharing and Supportive   Affect: Appropriate   Cognitive: Alert and Oriented   Insight: Developing/Improving and Engaged   Engagement in Therapy: Developing/Improving and Engaged   Modes of Intervention: Clarification, Confrontation, Discussion, Education, Exploration, Limit-setting, Orientation, Problem-solving, Rapport Building, Dance movement psychotherapisteality Testing, Socialization and Support   Summary of Progress/Problems: The topic for today was feelings about relapse. Pt discussed what relapse prevention is to them and identified triggers that they are on the path to relapse. Pt processed their feeling towards relapse and was able to relate to peers. Pt discussed coping skills that can be used for relapse prevention. Pt processed the pt's feelings towards feeling balanced and reported that the pt felt that feeling balanced was feeling as if the pt were on an "even keel" and as if the pt was adjusted to life.  Pt was polite and cooperative with the CSW and other group members and focused and attentive to the topics discussed and the sharing of others.  Pt seems to have made great improvement during group, as evidenced by increased sharing, sharing at length when prompted and pt presents a happy and increasingly energetic, as compared to previous sessions. Pt also shared the pt was looking forward to being open and honest with others in order to feel balanced at discharge.   Dorothe PeaJonathan F. Irmgard Rampersaud, MSW, LCSWA, LCAS

## 2016-06-08 NOTE — [facility] Group Notes (Signed)
[facility] Group Notes:  (Nursing/MHT/Case Management/Adjunct)  Date:  06/08/2016  Time:  3:20 AM  Type of Therapy:  Psychoeducational Skills  Participation Level:  Active  Participation Quality:  Appropriate  Affect:  Appropriate  Cognitive:  Appropriate  Insight:  Appropriate  Engagement in Group:  Engaged  Modes of Intervention:  Discussion, Socialization and Support  Summary of Progress/Problems:  Linda MilroyLaquanda Y Erma Banks 06/08/2016, 3:20 AM

## 2016-06-09 LAB — GLUCOSE, CAPILLARY: GLUCOSE-CAPILLARY: 188 mg/dL — AB (ref 65–99)

## 2016-06-09 MED ORDER — CLONAZEPAM 1 MG PO TABS
1.0000 mg | ORAL_TABLET | Freq: Once | ORAL | Status: AC
  Administered 2016-06-09: 1 mg via ORAL
  Filled 2016-06-09: qty 1

## 2016-06-09 NOTE — Progress Notes (Signed)
Recreation Therapy Notes  Date: 07.14.17 Time: 1:00 pm Location: Craft Room  Group Topic: Communication, Problem solving, Teamwork  Goal Area(s) Addresses:  Patient will effectively work with peer towards shared goal. Patient will identify skills used to make activity successful. Patient will identify benefit of using group skills effectively post d/c.  Behavioral Response: Attentive, Interactive  Intervention: [employer] HathawayPipe Cleaner Tower  Activity: Patients were given 15 pipe cleaners and instructed to build a free standing tower. Patients were given 2 minutes to strategize. After about 5 minutes of building, patients were instructed to put their dominant hand behind their back. After another 5 minutes of building, patients were instructed to stop talking to each other.  Education: LRT educated patients on healthy support systems.  Education Outcome: Acknowledges education/In group clarification offered  Clinical Observations/Feedback: Patient completed activity by building a free standing tower with peer. Patient used effective communication, problem solving, and teamwork. Patient contributed to group discussion by stating what skills she used for group, why communication, problem solving, and teamwork are important, how she felt after using these skills, what prevents her from using these skills, and what would change for her if she started using these skills at home.  Jacquelynn CreeGreene,Anaija Wissink M, LRT/CTRS 06/09/2016 3:01 PM

## 2016-06-09 NOTE — Progress Notes (Signed)
D: Patient appear flat. She is very reluctant about going home. When asking about discharge she stated she wasn't leaving tomorrow. She stated in the group she wasn't comfortable tomorrow and wanted to stay through the weekend. She denies SI/HI/AVH. Rates her pain at a 8/10 in her lower back.  A: Medication given with education. Encouragement provided. PRN tylenol given.  R: Patient was compliant with medication. She has remained calm and cooperative. Safety maintained with 15 min checks.

## 2016-06-09 NOTE — Tx Team (Signed)
Interdisciplinary Treatment Plan Update (Adult)  Date:  06/09/2016 Time Reviewed:  2:25 PM  Progress in Treatment: Attending groups: Yes. Participating in groups:  Yes. Taking medication as prescribed:  Yes. Tolerating medication:  Yes. Family/Significant othe contact made:  Yes, individual(s) contacted:  cousin Patient understands diagnosis:  Yes. Discussing patient identified problems/goals with staff:  Yes. Medical problems stabilized or resolved:  Yes. Denies suicidal/homicidal ideation: Yes. Issues/concerns per patient self-inventory:  Yes. Other:  New problem(s) identified: No, Describe:  NA  Discharge Plan or Barriers: Pt plans to return home and follow up with outpatient.   Reason for Continuation of Hospitalization: Anxiety Depression Medication stabilization Suicidal ideation  Comments:Ms. Byland continues to improve. Auditory hallucinations have resolved. Mood is improving, affect brighter. She has only passing suicidal thoughts when thinking about her mother who just passed away. There are no somatic complaints. Tolerates medications well. Good group participation. We were so far unable to identify a provider to follow up with due to [facility].  Estimated length of stay: Pt will likely d/c today.   New goal(s): NA  Review of initial/current patient goals per problem list:   1.  Goal(s): Patient will participate in aftercare plan * Met: Yes * Target date: at discharge * As evidenced by: Patient will participate within aftercare plan AEB aftercare provider and housing plan at discharge being identified.   2.  Goal (s): Patient will exhibit decreased depressive symptoms and suicidal ideations. * Met: Yes *  Target date: at discharge * As evidenced by: Patient will utilize self rating of depression at 3 or below and demonstrate decreased signs of depression or be deemed stable for discharge by MD.   3.  Goal(s): Patient will demonstrate decreased signs and  symptoms of anxiety. * Met: Yes * Target date: at discharge * As evidenced by: Patient will utilize self rating of anxiety at 3 or below and demonstrated decreased signs of anxiety, or be deemed stable for discharge by MD  Attendees: Patient:  Debra Colon 7/14/20172:25 PM  Family:   7/14/20172:25 PM  Physician:   Dr. Bary Leriche  7/14/20172:25 PM  Nursing:   Elige Radon, RN  7/14/20172:25 PM  Case Manager:   7/14/20172:25 PM  Counselor:   7/14/20172:25 PM  Other:  Wray Kearns, Delia 7/14/20172:25 PM  Other:  Everitt Amber, [place]  7/14/20172:25 PM  Other:   7/14/20172:25 PM  Other:  7/14/20172:25 PM  Other:  7/14/20172:25 PM  Other:  7/14/20172:25 PM  Other:  7/14/20172:25 PM  Other:  7/14/20172:25 PM  Other:  7/14/20172:25 PM  Other:   7/14/20172:25 PM   Scribe for Treatment Team:   Wray Kearns, MSW, [place]  06/09/2016, 2:25 PM

## 2016-06-09 NOTE — [facility] Group Notes (Signed)
[facility] Group Notes:  (Nursing/MHT/Case Management/Adjunct)  Date:  06/09/2016  Time:  2:39 AM  Type of Therapy:  Group Therapy  Participation Level:  Active  Participation Quality:  Appropriate  Affect:  Appropriate  Cognitive:  Appropriate  Insight:  Appropriate and Good  Engagement in Group:  Developing/Improving and Engaged  Modes of Intervention:  Discussion  Summary of Progress/Problems: Pt stated that although she is feeling better she does not want to go home until Monday. The anniversary of the Pt's mothers passing is this Sunday and she does not feel as though she could handle it at home. Staff advised pt to speak with her doctor about her discharge plans.   Fanny Skatesshley Imani Kinya Meine 06/09/2016, 2:39 AM

## 2016-06-09 NOTE — Plan of Care (Signed)
Problem: Safety: Goal: Periods of time without injury will increase Outcome: Progressing Patient has been without injury during this shift.    

## 2016-06-09 NOTE — [facility] Group Notes (Signed)
[facility] LCSW Group Therapy   06/09/2016 9:30 AM   Type of Therapy: Group Therapy   Participation Level: Active   Participation Quality: Attentive, Sharing and Supportive   Affect: Appropriate   Cognitive: Alert and Oriented   Insight: Developing/Improving and Engaged   Engagement in Therapy: Developing/Improving and Engaged   Modes of Intervention: Clarification, Confrontation, Discussion, Education, Exploration, Limit-setting, Orientation, Problem-solving, Rapport Building, Dance movement psychotherapisteality Testing, Socialization and Support   Summary of Progress/Problems: The topic for today was feelings about relapse. Pt discussed what relapse prevention is to them and identified triggers that they are on the path to relapse. Pt processed their feeling towards relapse and was able to relate to peers. Pt discussed coping skills that can be used for relapse prevention.  Pt shared that for the pt relapse is not continuing treatment once the pt is discharged, that the pt has felt in the past that "I'm good now" and then discontinued medications or neglected them due to caring fpr others, rather than theirself.  Pt demonstrated insight in AEB the pt discussing where the pt can improve, whether than where others need to improve.  Pt seems to have made great improvement during group, as evidenced by increased sharing, sharing at length when prompted and pt presents as happy and increasingly energetic, as compared to previous sessions.    Dorothe PeaJonathan F. Carlissa Pesola, MSW, LCSWA, LCAS

## 2016-06-09 NOTE — [facility] Suicide Risk Assessment (Signed)
[facility] INPATIENT:  Family/Significant Other Suicide Prevention Education  Suicide Prevention Education:  Education Completed;Carol Logan Boresvans (cousin) [phone] has been identified by the patient as the family member/significant other with whom the patient will be residing, and identified as the person(s) who will aid the patient in the event of a mental health crisis (suicidal ideations/suicide attempt).  With written consent from the patient, the family member/significant other has been provided the following suicide prevention education, prior to the and/or following the discharge of the patient.  The suicide prevention education provided includes the following:  Suicide risk factors  Suicide prevention and interventions  National Suicide Hotline telephone number  [facility] assessment telephone number  [facility] Emergency Assistance 911  [facility] and/or Residential Mobile Crisis Unit telephone number  Request made of family/significant other to:  Remove weapons (e.g., guns, rifles, knives), all items previously/currently identified as safety concern.    Remove drugs/medications (over-the-counter, prescriptions, illicit drugs), all items previously/currently identified as a safety concern.  The family member/significant other verbalizes understanding of the suicide prevention education information provided.  The family member/significant other agrees to remove the items of safety concern listed above.  Sempra EnergyCandace L Cordarrell Sane MSW, LCSWA  06/09/2016, 2:24 PM

## 2016-06-09 NOTE — Progress Notes (Signed)
Pleasant and cooperative.  Denies SI.  Rates depression as a 6/10.  Endorses some anxiety.  Denies AVH. Discharge instructions given, verbalized understanding.  Prescriptions and seven day supply of medications given.  Personal belongings returned.  Escorted off unit by this Clinical research associatewriter to main entrance to meet family to travel home.

## 2016-06-10 NOTE — Progress Notes (Signed)
  [facility] :  Will you be returning to the same living situation after discharge:  Yes,  home At discharge, do you have transportation home?: Yes,  family  Do you have the ability to pay for your medications: Yes,  [facility]  Release of information consent forms completed and in the chart;  Patient's signature needed at discharge.  Patient to Follow up at: Follow-up Information    [facility].   Why:  Your hospital follow up appointment will be walk in. Walk in hours are Monday through Friday between 8:00am and 10:00am. Please call Unk PintoHarvey Bryant [phone].    Contact information:   [address] [place] [address] [phone]       Follow up with [facility].   Specialty:  Hospice and Palliative Medicine   Why:  *Information only* Please call to schedule an appointment for grief counseling.    Contact information:   [address] [place] [address] [phone]       Follow up with [place] [facility] .   Why:  *Information only* Please go as soon as possible to discuss your medicaid transfer.    Contact information:    [address],  [place], [place] [address] Phone: [phone]      Next level of care provider has access to [facility] Link:no  Safety Planning and Suicide Prevention discussed: Yes,  With patient and cousin.   Have you used any form of tobacco in the last 30 days? (Cigarettes, Smokeless Tobacco, Cigars, and/or Pipes): Yes  Has patient been referred to the Quitline?: Patient refused referral  Patient has been referred for addiction treatment: Pt. refused referral  Rondall AllegraCandace L Bay Jarquin MSW, LCSWA  06/10/2016, 9:19 AM

## 2016-08-01 ENCOUNTER — Ambulatory Visit: Payer: Self-pay

## 2016-08-15 ENCOUNTER — Ambulatory Visit: Payer: Self-pay

## 2016-08-24 ENCOUNTER — Emergency Department
Admission: EM | Admit: 2016-08-24 | Discharge: 2016-08-24 | Disposition: A | Discharge status: Home / Self Care | Payer: Medicare Other | Attending: Emergency Medicine | Admitting: Emergency Medicine

## 2016-08-24 DIAGNOSIS — M549 Dorsalgia, unspecified: Secondary | ICD-10-CM

## 2016-08-24 DIAGNOSIS — M5442 Lumbago with sciatica, left side: Secondary | ICD-10-CM | POA: Diagnosis not present

## 2016-08-24 DIAGNOSIS — G8929 Other chronic pain: Secondary | ICD-10-CM | POA: Insufficient documentation

## 2016-08-24 DIAGNOSIS — M545 Low back pain: Secondary | ICD-10-CM | POA: Diagnosis present

## 2016-08-24 DIAGNOSIS — E119 Type 2 diabetes mellitus without complications: Secondary | ICD-10-CM | POA: Insufficient documentation

## 2016-08-24 DIAGNOSIS — F17210 Nicotine dependence, cigarettes, uncomplicated: Secondary | ICD-10-CM | POA: Diagnosis not present

## 2016-08-24 DIAGNOSIS — I10 Essential (primary) hypertension: Secondary | ICD-10-CM | POA: Diagnosis not present

## 2016-08-24 DIAGNOSIS — Z79899 Other long term (current) drug therapy: Secondary | ICD-10-CM | POA: Insufficient documentation

## 2016-08-24 DIAGNOSIS — M5432 Sciatica, left side: Secondary | ICD-10-CM

## 2016-08-24 MED ORDER — DIAZEPAM 2 MG PO TABS: 2.0000 mg | ORAL_TABLET | Freq: Three times a day (TID) | ORAL | 0 refills | Status: AC | PRN

## 2016-08-24 MED ORDER — ORPHENADRINE CITRATE 30 MG/ML IJ SOLN
60.0000 mg | Freq: Two times a day (BID) | INTRAMUSCULAR | Status: DC
  Administered 2016-08-24: 60 mg via INTRAMUSCULAR
  Filled 2016-08-24: qty 2

## 2016-08-24 MED ORDER — METHYLPREDNISOLONE SODIUM SUCC 125 MG IJ SOLR
80.0000 mg | Freq: Once | INTRAMUSCULAR | Status: AC
  Administered 2016-08-24: 80 mg via INTRAMUSCULAR
  Filled 2016-08-24: qty 2

## 2016-08-24 MED ORDER — METHYLPREDNISOLONE SODIUM SUCC 125 MG IJ SOLR
80.0000 mg | Freq: Once | INTRAMUSCULAR | Status: DC
Start: 2016-08-24 — End: 2016-08-24

## 2016-08-24 MED ORDER — NAPROXEN 500 MG PO TABS: 500.0000 mg | ORAL_TABLET | Freq: Two times a day (BID) | ORAL | 0 refills | Status: DC

## 2016-08-24 MED ORDER — DIAZEPAM 5 MG/ML IJ SOLN: 5.0000 mg | Freq: Once | INTRAMUSCULAR | Status: DC

## 2016-08-24 NOTE — ED Triage Notes (Signed)
Pt to triage via w/c with no distress ntoed; Pt reports lower back pain, increased last 2 days unrelieved by percocet; hx "slipped disc"

## 2016-08-24 NOTE — ED Provider Notes (Signed)
[facility] Emergency Department Provider Note  ____________________________________________  Time seen: Approximately 10:17 PM  I have reviewed the triage vital signs and the nursing notes.   HISTORY  Chief Complaint Back Pain    HPI Linda Banks is a 60 y.o. female , NAD, presents to the emergency department with 2 day history of lower back pain. 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 Patient was previously in pain management but has recently moved to the area. States she establish care with her primary care provider 2 days ago who gave her Percocet 10 mg tablets to take for her pain. States that the Percocet has not helped over the last 48 hours. States her pain is across the entire lower back and can sometimes radiate into the left buttocks and thigh. Denies any injuries, traumas or falls. Has not had any numbness, weakness, tingling. Denies saddle paresthesias nor loss of bowel or bladder control. No rashes or skin sores. No changes in urinary habits.   Past Medical History:  Diagnosis Date  . Bipolar 1 disorder (HCC)   . Bronchitis   . Diabetes mellitus without complication (HCC)   . Hyperlipidemia   . Hypertension   . Schizophrenia [place](HCC)     Patient Active Problem List   Diagnosis Date Noted  . Cocaine use disorder, moderate, dependence (HCC) 06/05/2016  . Bipolar I disorder, most recent episode (or current) depressed, severe, specified as with psychotic behavior (HCC) 06/03/2016  . Suicidal ideation 06/03/2016  . GERD (gastroesophageal reflux disease) 06/03/2016  . Tobacco use disorder 06/03/2016  . Sedative, hypnotic or anxiolytic use disorder, severe, dependence (HCC) 06/03/2016    Past Surgical History:  Procedure Laterality Date  . TUBAL LIGATION      Prior to Admission medications   Medication Sig Start Date End Date Taking? Authorizing Provider  diazepam (VALIUM) 2 MG  tablet Take 1 tablet (2 mg total) by mouth every 8 (eight) hours as needed for muscle spasms. 08/24/16 08/31/16  Jami L Hagler, PA-C  esomeprazole (NEXIUM) 40 MG capsule Take 1 capsule (40 mg total) by mouth daily at 12 noon. 06/08/16   Shari ProwsJolanta B Pucilowska, MD  naproxen (NAPROSYN) 500 MG tablet Take 1 tablet (500 mg total) by mouth 2 (two) times daily with a meal. 08/24/16   Jami L Hagler, PA-C  QUEtiapine (SEROQUEL XR) 300 MG 24 hr tablet Take 1 tablet (300 mg total) by mouth at bedtime. 06/08/16   Shari ProwsJolanta B Pucilowska, MD  venlafaxine XR (EFFEXOR-XR) 150 MG 24 hr capsule Take 1 capsule (150 mg total) by mouth daily with breakfast. 06/08/16   Jolanta B Pucilowska, MD  zolpidem (AMBIEN) 5 MG tablet Take 1 tablet (5 mg total) by mouth at bedtime as needed for sleep. 06/08/16   Shari ProwsJolanta B Pucilowska, MD    Allergies Review of patient's allergies indicates no known allergies.  No family history on file.  Social History Social History  Substance Use Topics  . Smoking status: Current Every Day Smoker    Packs/day: 1.00    Types: Cigarettes  . Smokeless tobacco: Never Used  . Alcohol use No     Review of Systems Constitutional: No fever/chills Cardiovascular: No chest pain. Respiratory: No shortness of breath.  Gastrointestinal: No abdominal pain.  No nausea, vomiting.   Musculoskeletal: Positive for back pain.  Skin: Negative for rash. Neurological: Negative for numbness, weakness, tingling. No saddle paresthesias, loss of bowel or bladder control.  10-point ROS otherwise negative.  ____________________________________________   PHYSICAL EXAM:  VITAL SIGNS: ED Triage Vitals  Enc Vitals Group     BP 08/24/16 2132 128/68     Pulse Rate 08/24/16 2132 95     Resp 08/24/16 2132 (!) 22     Temp 08/24/16 2132 98.1 F (36.7 C)     Temp Source 08/24/16 2132 Oral     SpO2 08/24/16 2132 98 %     Weight 08/24/16 2135 186 lb (84.4 kg)     Height 08/24/16 2135 5\' 4"  (1.626 m)     Head  Circumference --      Peak Flow --      Pain Score 08/24/16 2139 10     Pain Loc --      Pain Edu? --      Excl. in GC? --      Constitutional: Alert and oriented. Well appearing and in no acute distress, but in pain. Eyes: Conjunctivae are normal.  Head: Atraumatic. Cardiovascular:  Good peripheral circulation with 2+ pulses noted in bilateral lower extremities. Respiratory: Normal respiratory effort without tachypnea or retractions.  Musculoskeletal: Posture is tilted forward and to the right due to back pain. Tenderness to palpation diffusely about the lumbar spine and paraspinal regions with mild muscle spasm noted. Positive left SI joint tenderness to deep palpation. No tenderness to palpation about the left hip or thigh. No lower extremity tenderness nor edema.  No joint effusions. Neurologic:  Normal speech and language. No gross focal neurologic deficits are appreciated.  Skin:  Skin is warm, dry and intact. No rash, redness, skin sores noted. Psychiatric: Mood and affect are normal. Speech and behavior are normal. Patient exhibits appropriate insight and judgement.   ____________________________________________   LABS  None ____________________________________________  EKG  None ____________________________________________  RADIOLOGY  None ____________________________________________    PROCEDURES  Procedure(s) performed: None   Procedures   Medications  orphenadrine (NORFLEX) injection 60 mg (60 mg Intramuscular Given 08/24/16 2310)  methylPREDNISolone sodium succinate (SOLU-MEDROL) 125 mg/2 mL injection 80 mg (80 mg Intramuscular Given 08/24/16 2310)   Patient notes significant improvement in pain since being given Norflex and Solu-Medrol. She continues to denies saddle paresthesias nor loss of bowel or bladder control. Has not had any numbness, weakness, tingling since being in the emergency department. Patient is observed to be sitting upright and visibly  in less pain.  ____________________________________________   INITIAL IMPRESSION / ASSESSMENT AND PLAN / ED COURSE  Pertinent labs & imaging results that were available during my care of the patient were reviewed by me and considered in my medical decision making (see chart for details).  Clinical Course  Comment By Time  Per the [place] controlled substance reporting system the patient received #44 Percocet 10 mg tablets on 08/22/2016 from Dr. Bluford Main, provider at [facility]  Hope Pigeon, PA-C 09/28 2219    Patient's diagnosis is consistent with Sciatica of left side and chronic back pain. Patient will be discharged home with prescriptions for diazepam and naproxen to take as directed. Patient is to apply warm heat to the affected areas 20 minutes 3-4 times daily as needed. Patient is to follow up with Dr. Hyacinth Meeker in orthopedics if symptoms persist past this treatment course. Patient is given ED precautions to return to the ED for any worsening or new symptoms.    ____________________________________________  FINAL CLINICAL IMPRESSION(S) / ED DIAGNOSES  Final diagnoses:  Sciatica of left side  Chronic back  pain      NEW MEDICATIONS STARTED DURING THIS VISIT:  New Prescriptions   DIAZEPAM (VALIUM) 2 MG TABLET    Take 1 tablet (2 mg total) by mouth every 8 (eight) hours as needed for muscle spasms.   NAPROXEN (NAPROSYN) 500 MG TABLET    Take 1 tablet (500 mg total) by mouth 2 (two) times daily with a meal.         Hope Pigeon, PA-C 08/24/16 2349    Emily Filbert, MD 09/03/16 [phone]

## 2017-01-23 ENCOUNTER — Other Ambulatory Visit: Payer: Self-pay | Admitting: Psychiatry

## 2017-01-28 ENCOUNTER — Emergency Department: Payer: Medicare Other

## 2017-01-28 ENCOUNTER — Encounter: Payer: Self-pay | Admitting: Emergency Medicine

## 2017-01-28 ENCOUNTER — Emergency Department
Admission: EM | Admit: 2017-01-28 | Discharge: 2017-01-28 | Disposition: A | Discharge status: Home / Self Care | Payer: Medicare Other | Attending: Emergency Medicine | Admitting: Emergency Medicine

## 2017-01-28 DIAGNOSIS — M5440 Lumbago with sciatica, unspecified side: Secondary | ICD-10-CM | POA: Insufficient documentation

## 2017-01-28 DIAGNOSIS — I10 Essential (primary) hypertension: Secondary | ICD-10-CM | POA: Insufficient documentation

## 2017-01-28 DIAGNOSIS — R319 Hematuria, unspecified: Secondary | ICD-10-CM | POA: Diagnosis present

## 2017-01-28 DIAGNOSIS — F17210 Nicotine dependence, cigarettes, uncomplicated: Secondary | ICD-10-CM | POA: Diagnosis not present

## 2017-01-28 DIAGNOSIS — G8929 Other chronic pain: Secondary | ICD-10-CM

## 2017-01-28 DIAGNOSIS — Z79899 Other long term (current) drug therapy: Secondary | ICD-10-CM | POA: Diagnosis not present

## 2017-01-28 DIAGNOSIS — E119 Type 2 diabetes mellitus without complications: Secondary | ICD-10-CM | POA: Diagnosis not present

## 2017-01-28 DIAGNOSIS — F1410 Cocaine abuse, uncomplicated: Secondary | ICD-10-CM | POA: Insufficient documentation

## 2017-01-28 HISTORY — DX: Lumbago with sciatica, unspecified side: M54.40

## 2017-01-28 HISTORY — DX: Patient's other noncompliance with medication regimen for other reason: Z91.148

## 2017-01-28 HISTORY — DX: Patient's other noncompliance with medication regimen: Z91.14

## 2017-01-28 HISTORY — DX: Lumbago with sciatica, right side: M54.41

## 2017-01-28 HISTORY — DX: Other chronic pain: G89.29

## 2017-01-28 HISTORY — DX: Cocaine abuse, uncomplicated: F14.10

## 2017-01-28 LAB — COMPREHENSIVE METABOLIC PANEL
ALBUMIN: 3.8 g/dL (ref 3.5–5.0)
ALT: 23 U/L (ref 14–54)
ANION GAP: 10 (ref 5–15)
AST: 30 U/L (ref 15–41)
Alkaline Phosphatase: 69 U/L (ref 38–126)
BUN: 16 mg/dL (ref 6–20)
CO2: 22 mmol/L (ref 22–32)
Calcium: 9.1 mg/dL (ref 8.9–10.3)
Chloride: 105 mmol/L (ref 101–111)
Creatinine, Ser: 0.76 mg/dL (ref 0.44–1.00)
GFR calc Af Amer: >60 mL/min (ref >60)
GFR calc non Af Amer: >60 mL/min (ref >60)
GLUCOSE: 155 mg/dL — AB (ref 65–99)
POTASSIUM: 4.4 mmol/L (ref 3.5–5.1)
SODIUM: 137 mmol/L (ref 135–145)
Total Bilirubin: 0.7 mg/dL (ref 0.3–1.2)
Total Protein: 7.5 g/dL (ref 6.5–8.1)

## 2017-01-28 LAB — LIPASE, BLOOD: Lipase: 11 U/L (ref 11–51)

## 2017-01-28 LAB — URINE DRUG SCREEN, QUALITATIVE (ARMC ONLY)
AMPHETAMINES, UR SCREEN: NOT DETECTED
BENZODIAZEPINE, UR SCRN: NOT DETECTED
Barbiturates, Ur Screen: NOT DETECTED
COCAINE METABOLITE, UR ~~LOC~~: POSITIVE — AB
Cannabinoid 50 Ng, Ur ~~LOC~~: NOT DETECTED
MDMA (Ecstasy)Ur Screen: NOT DETECTED
Methadone Scn, Ur: NOT DETECTED
OPIATE, UR SCREEN: NOT DETECTED
Phencyclidine (PCP) Ur S: NOT DETECTED
TRICYCLIC, UR SCREEN: NOT DETECTED

## 2017-01-28 LAB — URINALYSIS, COMPLETE (UACMP) WITH MICROSCOPIC
BILIRUBIN URINE: NEGATIVE
Glucose, UA: NEGATIVE mg/dL
HGB URINE DIPSTICK: NEGATIVE
KETONES UR: NEGATIVE mg/dL
LEUKOCYTES UA: NEGATIVE
NITRITE: NEGATIVE
PH: 6 (ref 5.0–8.0)
Protein, ur: NEGATIVE mg/dL
Specific Gravity, Urine: 1.023 (ref 1.005–1.030)

## 2017-01-28 LAB — CBC
HCT: 34.5 % — ABNORMAL LOW (ref 35.0–47.0)
Hemoglobin: 11.2 g/dL — ABNORMAL LOW (ref 12.0–16.0)
MCH: 26.7 pg (ref 26.0–34.0)
MCHC: 32.4 g/dL (ref 32.0–36.0)
MCV: 82.5 fL (ref 80.0–100.0)
PLATELETS: 285 10*3/uL (ref 150–440)
RBC: 4.18 MIL/uL (ref 3.80–5.20)
RDW: 16.5 % — AB (ref 11.5–14.5)
WBC: 7.8 10*3/uL (ref 3.6–11.0)

## 2017-01-28 LAB — TROPONIN I

## 2017-01-28 MED ORDER — LIDOCAINE 5 % EX PTCH
1.0000 | MEDICATED_PATCH | CUTANEOUS | Status: DC
  Administered 2017-01-28: 1 via TRANSDERMAL
  Filled 2017-01-28: qty 1

## 2017-01-28 MED ORDER — KETOROLAC TROMETHAMINE 30 MG/ML IJ SOLN
15.0000 mg | Freq: Once | INTRAMUSCULAR | Status: AC
  Administered 2017-01-28: 15 mg via INTRAMUSCULAR
  Filled 2017-01-28: qty 1

## 2017-01-28 MED ORDER — CYCLOBENZAPRINE HCL 5 MG PO TABS: 5.0000 mg | ORAL_TABLET | Freq: Three times a day (TID) | ORAL | 0 refills | Status: AC | PRN

## 2017-01-28 MED ORDER — LIDOCAINE 5 % EX PTCH: 1.0000 | MEDICATED_PATCH | Freq: Two times a day (BID) | CUTANEOUS | 0 refills | Status: AC

## 2017-01-28 NOTE — ED Notes (Signed)
Verbal report given to Jenna, RN.

## 2017-01-28 NOTE — ED Triage Notes (Addendum)
Pt to rm 14 via EMS from home, report palpitations, but states more concerned about hematuria x 2 weeks, worse over last 3 days.  Reports urinary urgency but unable to have large void.  EMS report pt has been out of medications x 1 month.  Pt also c/o chronic neck and back pain.  VSS per EMS.

## 2017-01-28 NOTE — Discharge Instructions (Signed)
You have been seen in the Emergency Department (ED)  today for chronic back pain.  Your workup and exam have not shown any acute abnormalities.  Please take your regular medications and avoid alcohol and drug use.  Please follow up with your doctor as soon as possible regarding today's ED visit and your back pain.  Return to the ED for worsening back pain, fever, weakness or numbness of either leg, or if you develop either (1) an inability to urinate or have bowel movements, or (2) loss of your ability to control your bathroom functions (if you start having "accidents"), or if you develop other new symptoms that concern you.

## 2017-01-28 NOTE — ED Provider Notes (Signed)
[facility] Emergency Department Provider Note  ____________________________________________   First MD Initiated Contact with Patient 01/28/17 [id number]     (approximate)  I have reviewed the triage vital signs and the nursing notes.   HISTORY  Chief Complaint Palpitations and Hematuria    HPI Linda Banks is a 61 y.o. female with a complicated past medical history as listed below that includes chronic back pain with sciatica, substance abuse, drug-seeking behavior, and pain management contracts that have been broken who presents by EMS for evaluation of back pain and blood in her urine that she sees when she wipes.  She states this is been going on for weeks.  Her story is somewhat inconsistent regarding following up with her primary care doctor but she initially claimed that she just moved here from [place] so she has not been able to see her doctor.  I pointed out that she has been to her doctor multiple times and has had multiple prescriptions filled over the last year and she clarified that she moved from [place] 11 months ago.  She states that nothing makes her symptoms better and movement makes the pain worse.  She has some pain radiating down from her low back down the back of her left leg.  She also has some pain in her knees.  She has not had any falls or injuries.  She denies fever/chills, chest pain, shortness of breath, nausea or vomiting.  She states that it does not hurt when she urinates, just that she sees some blood when she wipes.  She has not had any vaginal bleeding of which she is aware.  She has multiple empty pill bottles with her including some Suboxone but she states that her primary care doctor, Dr. Ellsworth Lennox, would not write her for any more because she broke her pain contract.  She used to go to Dr. Welton Flakes with the [facility] (verified in EMR).   Past Medical History:  Diagnosis Date  . Bipolar 1 disorder (HCC)   .  Bronchitis   . Chronic bilateral low back pain with sciatica   . Cocaine abuse   . Diabetes mellitus without complication (HCC)   . Hyperlipidemia   . Hypertension   . Pain management contract broken   . Schizophrenia [facility])     Patient Active Problem List   Diagnosis Date Noted  . Cocaine use disorder, moderate, dependence (HCC) 06/05/2016  . Bipolar I disorder, most recent episode (or current) depressed, severe, specified as with psychotic behavior (HCC) 06/03/2016  . Suicidal ideation 06/03/2016  . GERD (gastroesophageal reflux disease) 06/03/2016  . Tobacco use disorder 06/03/2016  . Sedative, hypnotic or anxiolytic use disorder, severe, dependence (HCC) 06/03/2016    Past Surgical History:  Procedure Laterality Date  . TUBAL LIGATION      Prior to Admission medications   Medication Sig Start Date End Date Taking? Authorizing Provider  esomeprazole (NEXIUM) 40 MG capsule Take 1 capsule (40 mg total) by mouth daily at 12 noon. 06/08/16  Yes Jolanta B Pucilowska, MD  Ferrous Sulfate (IRON) 28 MG TABS Take 28 mg by mouth daily.   Yes Historical Provider, MD  lisinopril (PRINIVIL,ZESTRIL) 5 MG tablet Take 5 mg by mouth daily.   Yes Historical Provider, MD  QUEtiapine (SEROQUEL XR) 300 MG 24 hr tablet Take 1 tablet (300 mg total) by mouth at bedtime. 06/08/16  Yes Jolanta B Pucilowska, MD  rosuvastatin (CRESTOR) 40 MG tablet Take 40 mg by mouth  daily.   Yes Historical Provider, MD  venlafaxine XR (EFFEXOR-XR) 150 MG 24 hr capsule Take 1 capsule (150 mg total) by mouth daily with breakfast. 06/08/16  Yes Jolanta B Pucilowska, MD  zolpidem (AMBIEN) 5 MG tablet Take 1 tablet (5 mg total) by mouth at bedtime as needed for sleep. Patient taking differently: Take 10 mg by mouth at bedtime.  06/08/16  Yes Jolanta B Pucilowska, MD  cyclobenzaprine (FLEXERIL) 5 MG tablet Take 1 tablet (5 mg total) by mouth 3 (three) times daily as needed for muscle spasms (or back pain). 01/28/17   Loleta Rose, MD   lidocaine (LIDODERM) 5 % Place 1 patch onto the skin every 12 (twelve) hours. Remove & Discard patch within 12 hours or as directed by MD.  Wynelle Fanny the patch off for 12 hours before applying a new one. 01/28/17 01/28/18  Loleta Rose, MD    Allergies Patient has no known allergies.  History reviewed. No pertinent family history.  Social History Social History  Substance Use Topics  . Smoking status: Current Every Day Smoker    Packs/day: 1.00    Types: Cigarettes  . Smokeless tobacco: Never Used  . Alcohol use No    Review of Systems Constitutional: No fever/chills Eyes: No visual changes. ENT: No sore throat. Cardiovascular: Denies chest pain. Respiratory: Denies shortness of breath. Gastrointestinal: No abdominal pain.  No nausea, no vomiting.  No diarrhea.  No constipation. Genitourinary: Negative for dysuria.  Blood on paper when she wipes Musculoskeletal: Low back pain radiating down LLE Skin: Negative for rash. Neurological: Negative for headaches, focal weakness or numbness.  10-point ROS otherwise negative.  ____________________________________________   PHYSICAL EXAM:  VITAL SIGNS: ED Triage Vitals  Enc Vitals Group     BP 01/28/17 0014 128/74     Pulse Rate 01/28/17 0014 (!) 104     Resp 01/28/17 0014 (!) 24     Temp 01/28/17 0014 98.1 F (36.7 C)     Temp Source 01/28/17 0014 Oral     SpO2 01/28/17 0014 100 %     Weight 01/28/17 0011 170 lb (77.1 kg)     Height 01/28/17 0011 5\' 4"  (1.626 m)     Head Circumference --      Peak Flow --      Pain Score 01/28/17 0011 10     Pain Loc --      Pain Edu? --      Excl. in GC? --     Constitutional: Alert and oriented. Appears uncomfortable in bed and anxiously is wiggling around in the bed Eyes: Conjunctivae are normal. PERRL. EOMI. Head: Atraumatic. Nose: No congestion/rhinnorhea. Mouth/Throat: Mucous membranes are moist. Neck: No stridor.  No meningeal signs.   Cardiovascular: Borderline tachycardia,   regular rhythm. Good peripheral circulation. Grossly normal heart sounds. Respiratory: Normal respiratory effort.  No retractions. Lungs CTAB. Gastrointestinal: Soft and nontender. No distention.  Musculoskeletal: Tenderness to palpation of the lower back paraspinal muscles to the slightest touch of the skin.  No rashes evident.  The patient states that it feels consistent with her chronic back pain and she states "it always hurts".  No gross deformities, no erythema, no evidence of infection, no step-offs or deformities or tenderness to palpation of the spine itself.  Neurologic:  Normal speech and language. No gross focal neurologic deficits are appreciated.  She is moving all 4 showed is without any difficulty and has no weakness, numbness, nor tingling Skin:  Skin is warm, dry and  intact. No rash noted.   ____________________________________________   LABS (all labs ordered are listed, but only abnormal results are displayed)  Labs Reviewed  CBC - Abnormal; Notable for the following:       Result Value   Hemoglobin 11.2 (*)    HCT 34.5 (*)    RDW 16.5 (*)    All other components within normal limits  COMPREHENSIVE METABOLIC PANEL - Abnormal; Notable for the following:    Glucose, Bld 155 (*)    All other components within normal limits  URINE DRUG SCREEN, QUALITATIVE ([facility] ONLY) - Abnormal; Notable for the following:    Cocaine Metabolite,Ur [place] POSITIVE (*)    All other components within normal limits  URINALYSIS, COMPLETE (UACMP) WITH MICROSCOPIC - Abnormal; Notable for the following:    Color, Urine YELLOW (*)    APPearance CLEAR (*)    Bacteria, UA RARE (*)    Squamous Epithelial / LPF 0-5 (*)    All other components within normal limits  URINE CULTURE  TROPONIN I  LIPASE, BLOOD   ____________________________________________  EKG  ED ECG REPORT I, Ravonda Brecheen, the attending physician, personally viewed and interpreted this ECG.  Date: 01/28/2017 EKG Time:  00:16 Rate: 102 Rhythm: Borderline sinus tachycardia QRS Axis: normal Intervals: normal ST/T Wave abnormalities: Non-specific ST segment / T-wave changes, but no evidence of acute ischemia. Conduction Disturbances: none Narrative Interpretation: unremarkable  ____________________________________________  RADIOLOGY   Dg Chest 2 View  Result Date: 01/28/2017 CLINICAL DATA:  Acute onset of palpitations. Difficulty breathing and chronic dry cough. Subacute onset of hematuria. Initial encounter. EXAM: CHEST  2 VIEW COMPARISON:  Chest radiograph performed 06/04/2016 FINDINGS: The lungs are well-aerated. Chronically increased interstitial markings are noted. There is no evidence of pleural effusion or pneumothorax. The heart is normal in size; the mediastinal contour is within normal limits. No acute osseous abnormalities are seen. IMPRESSION: Chronically increased interstitial markings noted. Electronically Signed   By: Roanna Raider M.D.   On: 01/28/2017 00:56    ____________________________________________   PROCEDURES  Procedure(s) performed:   Procedures   Critical Care performed: No ____________________________________________   INITIAL IMPRESSION / ASSESSMENT AND PLAN / ED COURSE  Pertinent labs & imaging results that were available during my care of the patient were reviewed by me and considered in my medical decision making (see chart for details).  The patient has a well-documented history of chronic low back pain and also has some concerning drug-seeking behaviors.  A bladder scan revealed only 80 mL of urine in the bladder and she is not reporting any history of urinary retention or overflow and she has no numbness or tingling in her extremities except for her chronic sciatica.  There is no concern at this point for cauda equina.  I explained to her that we are going to avoid narcotics but I would be happy to treat her pain with a Lidoderm patch on her lower back and an  injection of Toradol while we further evaluate her urine.  She is going to attempt to provide a urine sample but we will in and out catheterized if necessary.  She may have a UTI or pyelonephritis although her labs are reassuring at this time.  I will reassess after the urinalysis to determine the need for any imaging since kidney stone is also possible although less likely.   Clinical Course as of Jan 29 307  Wynelle Link Jan 28, 2017  0307 The patient remains somewhat fidgety in the bed but  she was using her phone in no acute distress when I reassessed her.  Her urinalysis was unremarkable with no evidence of hematuria and her labs were all reassuring.  She is cocaine positive on the urine drug screen.  I counseled her about that and explained about the chronic nature of her back pain.  When the Lidoderm patch was applied she reported that it did help her feel better so prescribed the Lidoderm patch as well as some Flexeril.  I encourage close outpatient follow-up with her regular doctor and gave my usual and customary return precautions.  [CF]    Clinical Course User Index [CF] Loleta Roseory Wen Merced, MD    ____________________________________________  FINAL CLINICAL IMPRESSION(S) / ED DIAGNOSES  Final diagnoses:  Chronic low back pain with sciatica, sciatica laterality unspecified, unspecified back pain laterality  Cocaine abuse     MEDICATIONS GIVEN DURING THIS VISIT:  Medications  lidocaine (LIDODERM) 5 % 1 patch (1 patch Transdermal Patch Applied 01/28/17 0157)  ketorolac (TORADOL) 30 MG/ML injection 15 mg (15 mg Intramuscular Given 01/28/17 0157)     NEW OUTPATIENT MEDICATIONS STARTED DURING THIS VISIT:  New Prescriptions   CYCLOBENZAPRINE (FLEXERIL) 5 MG TABLET    Take 1 tablet (5 mg total) by mouth 3 (three) times daily as needed for muscle spasms (or back pain).   LIDOCAINE (LIDODERM) 5 %    Place 1 patch onto the skin every 12 (twelve) hours. Remove & Discard patch within 12 hours or as  directed by MD.  Wynelle FannyLeave the patch off for 12 hours before applying a new one.    Modified Medications   No medications on file    Discontinued Medications   NAPROXEN (NAPROSYN) 500 MG TABLET    Take 1 tablet (500 mg total) by mouth 2 (two) times daily with a meal.     Note:  This document was prepared using Dragon voice recognition software and may include unintentional dictation errors.    Loleta Roseory Massey Ruhland, MD 01/28/17 [phone]

## 2017-01-28 NOTE — ED Notes (Signed)
Reviewed d/c instructions, follow-up care, prescriptions with patient. Pt verbalized understanding.  

## 2017-01-28 NOTE — ED Notes (Signed)
Pt says her boyfriend called the ambulance tonight when she began to feel like her heart was "fluttering"; was resting when sensation started; pt adds she has had blood in her urine for about 2 weeks but denies dysuria and frequency; pt says she has been out of her pain medications for some time and c/o pain to her neck, her back and bilateral knees; no acute pain; pt restless in bed; pt says she doesn't get out of the house much due to phobias and so she's restless because she's nervous to be here;

## 2017-01-28 NOTE — ED Notes (Signed)
Pt to xray via stretcher with Jasmine from radiology

## 2017-01-28 NOTE — ED Notes (Addendum)
Pt has several empty medication bottles in her bag of medications; pt says she is out because she "just moved here from [place]"; medication bottles show label with local MD dating back as old as 10/11/16; these empty medication bottles include Suboxone 4mg /1mg , Quetiapine 300mg , Clonazepam 1mg , Clonazepam 0.5mg  and Hydroxyzine Pamoate 25mg ; pt also has several empty bottles with a different person's name that pt says is her boyfriend; these bottles include, Zolpidem, Alprazolam and Carisoprodol

## 2017-01-28 NOTE — ED Notes (Signed)
Pt up to toilet in room to void and collect urine specimen; pt given sterile container and wipes; verbalized understanding of proper collection procedure; pt encouraged to use pull cord if needs any help

## 2017-01-29 LAB — URINE CULTURE: SPECIAL REQUESTS: NORMAL

## 2017-04-30 ENCOUNTER — Ambulatory Visit: Payer: Self-pay | Admitting: Pain Medicine

## 2017-05-01 ENCOUNTER — Ambulatory Visit: Payer: Self-pay | Admitting: Pain Medicine

## 2018-01-25 IMAGING — CR DG CHEST 2V
2 series · 2 of 2 positions shown · non-contrast
Comparison: Chest radiograph performed 06/04/2016

CLINICAL DATA: Acute onset of palpitations. Difficulty breathing
and chronic dry cough. Subacute onset of hematuria. Initial
encounter.

EXAM:
CHEST  2 VIEW

[chest lat]
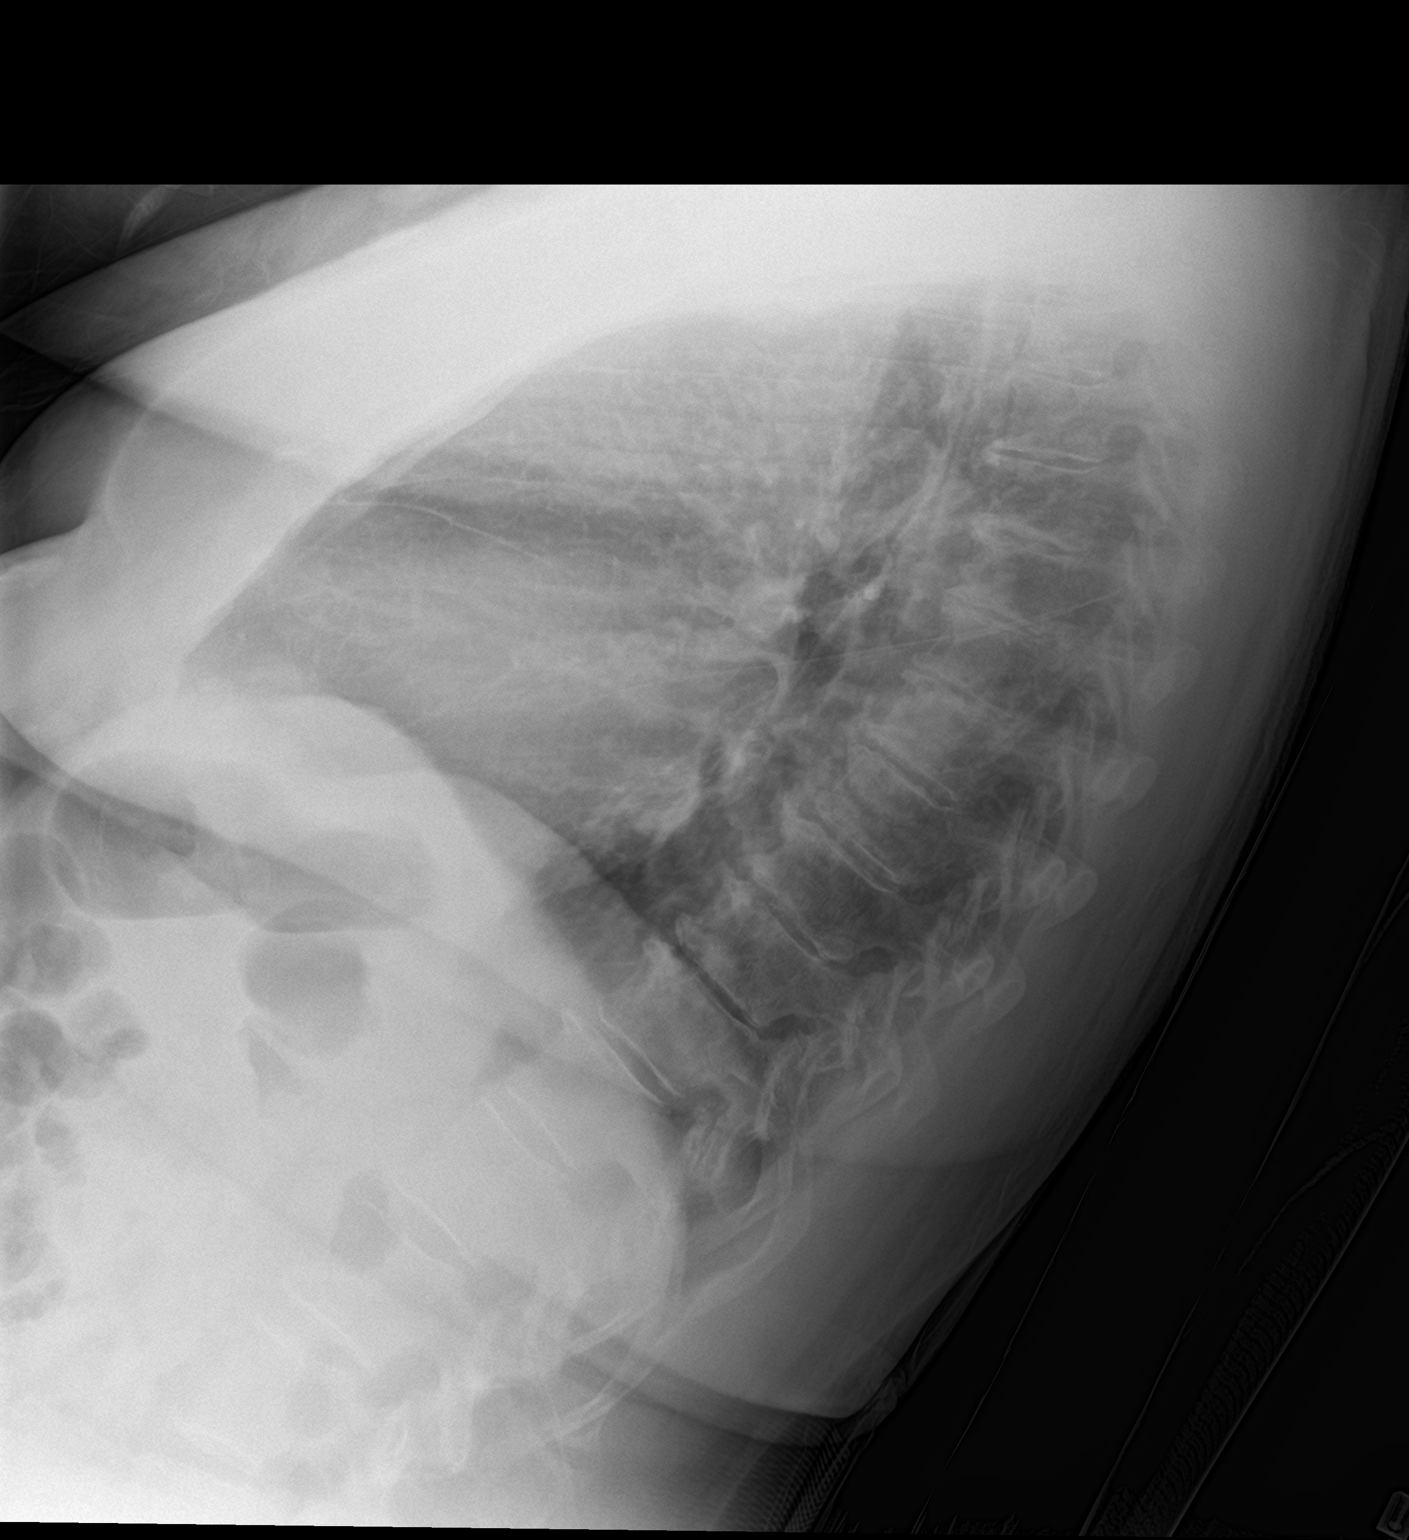

[chest ap]
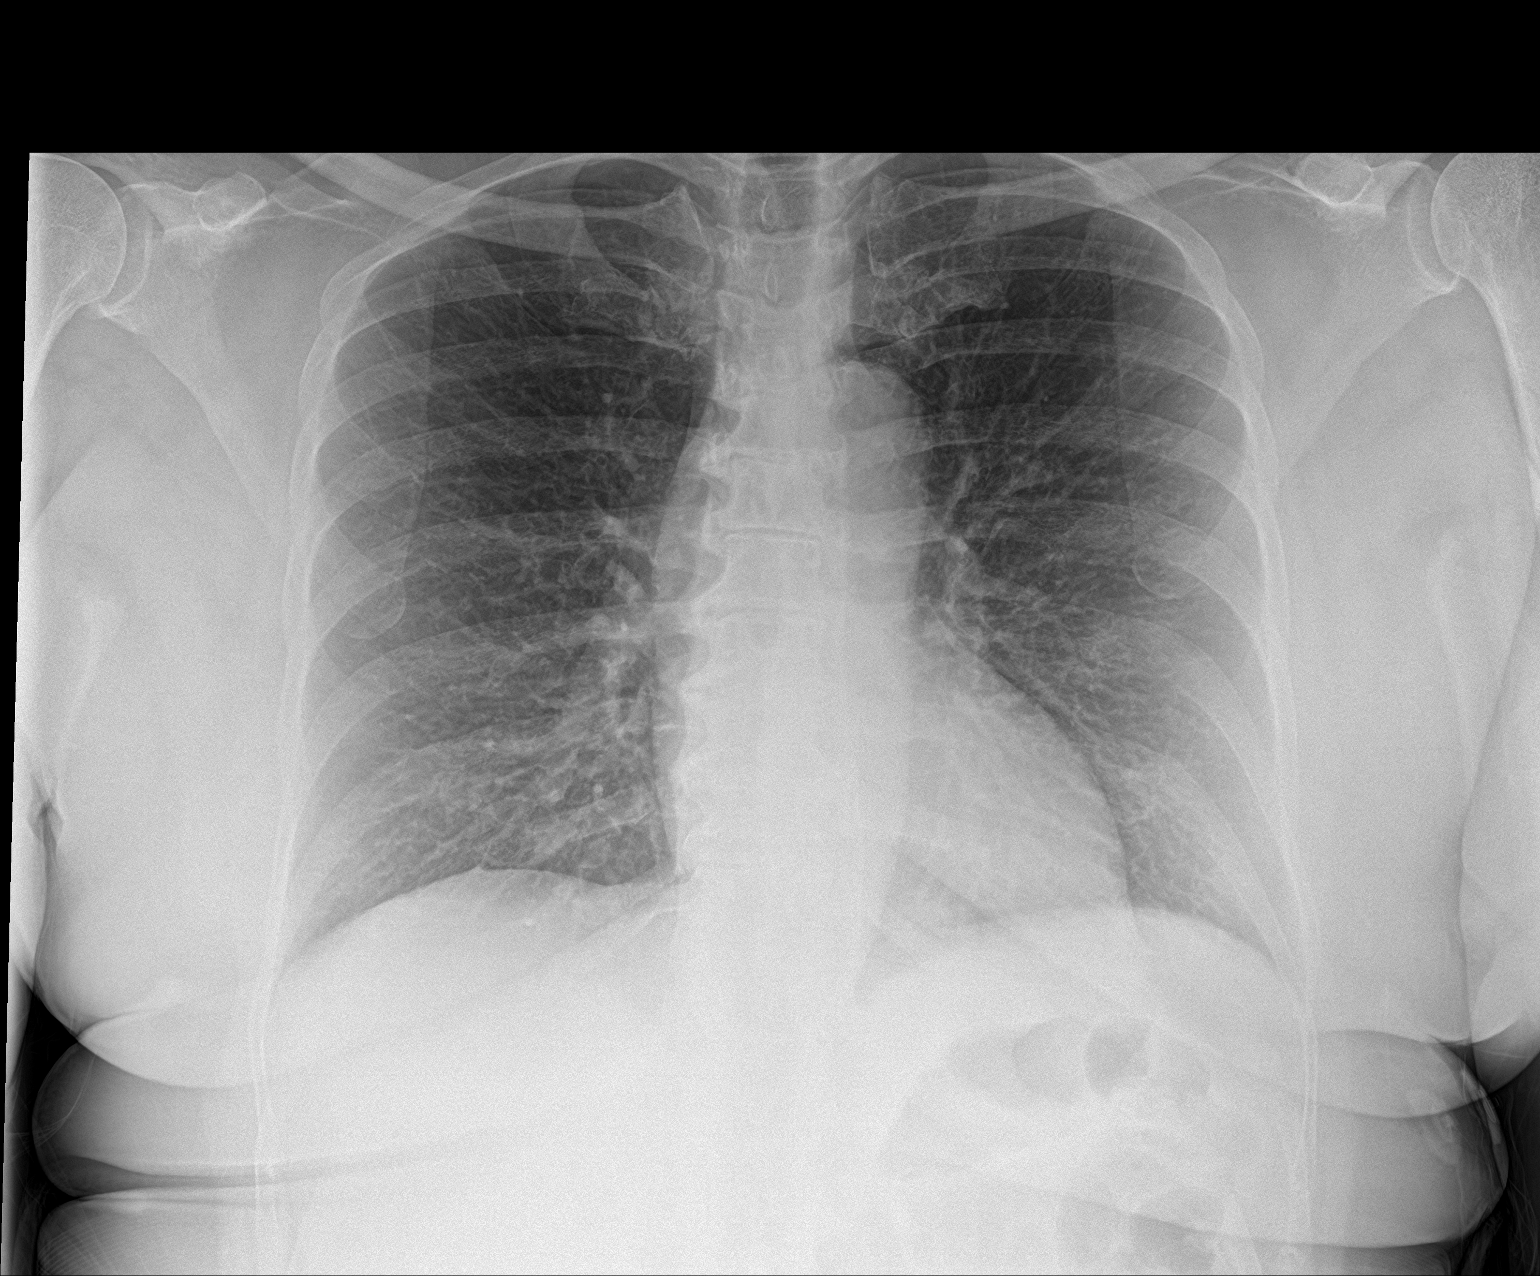

[2 of 2 positions shown; findings below may reference images not displayed]

FINDINGS: The lungs are well-aerated. Chronically increased interstitial
markings are noted. There is no evidence of pleural effusion or
pneumothorax.

The heart is normal in size; the mediastinal contour is within
normal limits. No acute osseous abnormalities are seen.
IMPRESSION: Chronically increased interstitial markings noted.
